# Patient Record
Sex: Female | Born: 1965
Health system: Southern US, Community
[De-identification: ages and names within clinical notes are randomized; demographics above are authoritative.]

## PROBLEM LIST (undated history)

## (undated) DIAGNOSIS — M1990 Unspecified osteoarthritis, unspecified site: Secondary | ICD-10-CM

## (undated) DIAGNOSIS — N926 Irregular menstruation, unspecified: Secondary | ICD-10-CM

## (undated) DIAGNOSIS — D219 Benign neoplasm of connective and other soft tissue, unspecified: Secondary | ICD-10-CM

## (undated) DIAGNOSIS — J302 Other seasonal allergic rhinitis: Secondary | ICD-10-CM

## (undated) DIAGNOSIS — J45909 Unspecified asthma, uncomplicated: Secondary | ICD-10-CM

## (undated) DIAGNOSIS — E119 Type 2 diabetes mellitus without complications: Secondary | ICD-10-CM

## (undated) HISTORY — PX: TONSILLECTOMY AND ADENOIDECTOMY: SHX28

## (undated) HISTORY — DX: Irregular menstruation, unspecified: N92.6

## (undated) HISTORY — DX: Benign neoplasm of connective and other soft tissue, unspecified: D21.9

## (undated) HISTORY — DX: Unspecified osteoarthritis, unspecified site: M19.90

## (undated) HISTORY — PX: ABDOMINAL HYSTERECTOMY: SHX81

## (undated) HISTORY — DX: Other seasonal allergic rhinitis: J30.2

## (undated) HISTORY — DX: Type 2 diabetes mellitus without complications: E11.9

## (undated) HISTORY — DX: Unspecified asthma, uncomplicated: J45.909

## (undated) HISTORY — PX: TONSILLECTOMY: SUR1361

## (undated) HISTORY — PX: TUBAL LIGATION: SHX77

---

## 2000-12-23 ENCOUNTER — Emergency Department (HOSPITAL_COMMUNITY): Admission: EM | Admit: 2000-12-23 | Discharge: 2000-12-24 | Payer: Self-pay | Admitting: *Deleted

## 2001-12-21 ENCOUNTER — Emergency Department (HOSPITAL_COMMUNITY): Admission: EM | Admit: 2001-12-21 | Discharge: 2001-12-21 | Payer: Self-pay

## 2002-08-24 ENCOUNTER — Ambulatory Visit (HOSPITAL_COMMUNITY): Admission: RE | Admit: 2002-08-24 | Discharge: 2002-08-24 | Payer: Self-pay | Admitting: Obstetrics

## 2002-08-24 ENCOUNTER — Encounter: Payer: Self-pay | Admitting: Obstetrics

## 2002-11-06 ENCOUNTER — Encounter: Payer: Self-pay | Admitting: Cardiology

## 2002-11-06 ENCOUNTER — Encounter: Admission: RE | Admit: 2002-11-06 | Discharge: 2002-11-06 | Payer: Self-pay | Admitting: Cardiology

## 2002-11-07 ENCOUNTER — Emergency Department (HOSPITAL_COMMUNITY): Admission: AD | Admit: 2002-11-07 | Discharge: 2002-11-07 | Payer: Self-pay | Admitting: *Deleted

## 2003-09-13 ENCOUNTER — Ambulatory Visit (HOSPITAL_COMMUNITY): Admission: RE | Admit: 2003-09-13 | Discharge: 2003-09-13 | Payer: Self-pay | Admitting: Obstetrics

## 2004-09-05 ENCOUNTER — Ambulatory Visit (HOSPITAL_COMMUNITY): Admission: RE | Admit: 2004-09-05 | Discharge: 2004-09-05 | Payer: Self-pay | Admitting: Neurology

## 2004-10-10 ENCOUNTER — Ambulatory Visit (HOSPITAL_COMMUNITY): Admission: RE | Admit: 2004-10-10 | Discharge: 2004-10-10 | Payer: Self-pay | Admitting: Obstetrics

## 2005-10-10 ENCOUNTER — Ambulatory Visit (HOSPITAL_COMMUNITY): Admission: RE | Admit: 2005-10-10 | Discharge: 2005-10-10 | Payer: Self-pay | Admitting: Cardiology

## 2007-01-06 ENCOUNTER — Emergency Department (HOSPITAL_COMMUNITY): Admission: EM | Admit: 2007-01-06 | Discharge: 2007-01-07 | Payer: Self-pay | Admitting: Emergency Medicine

## 2007-01-09 ENCOUNTER — Emergency Department (HOSPITAL_COMMUNITY): Admission: EM | Admit: 2007-01-09 | Discharge: 2007-01-09 | Payer: Self-pay | Admitting: Emergency Medicine

## 2008-07-14 ENCOUNTER — Emergency Department (HOSPITAL_COMMUNITY): Admission: EM | Admit: 2008-07-14 | Discharge: 2008-07-15 | Payer: Self-pay | Admitting: Emergency Medicine

## 2009-02-22 ENCOUNTER — Ambulatory Visit (HOSPITAL_COMMUNITY): Admission: RE | Admit: 2009-02-22 | Discharge: 2009-02-22 | Payer: Self-pay | Admitting: Obstetrics

## 2009-03-01 ENCOUNTER — Encounter: Admission: RE | Admit: 2009-03-01 | Discharge: 2009-03-01 | Payer: Self-pay | Admitting: Obstetrics

## 2010-03-30 ENCOUNTER — Ambulatory Visit (HOSPITAL_COMMUNITY)
Admission: RE | Admit: 2010-03-30 | Discharge: 2010-03-30 | Payer: No Typology Code available for payment source | Source: Home / Self Care | Attending: Obstetrics | Admitting: Obstetrics

## 2010-04-09 ENCOUNTER — Encounter: Payer: Self-pay | Admitting: Obstetrics
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
## 2010-08-16 ENCOUNTER — Emergency Department (HOSPITAL_COMMUNITY)
Admission: EM | Admit: 2010-08-16 | Discharge: 2010-08-17 | Disposition: A | Discharge status: Home / Self Care | Payer: Self-pay | Attending: Emergency Medicine | Admitting: Emergency Medicine

## 2010-08-16 DIAGNOSIS — N898 Other specified noninflammatory disorders of vagina: Secondary | ICD-10-CM | POA: Insufficient documentation

## 2010-08-16 DIAGNOSIS — R319 Hematuria, unspecified: Secondary | ICD-10-CM | POA: Insufficient documentation

## 2010-08-16 DIAGNOSIS — R109 Unspecified abdominal pain: Secondary | ICD-10-CM | POA: Insufficient documentation

## 2010-08-16 LAB — CBC
Hemoglobin: 13.7 g/dL (ref 12.0–15.0)
MCH: 28.6 pg (ref 26.0–34.0)
MCV: 85.6 fL (ref 78.0–100.0)
RBC: 4.79 MIL/uL (ref 3.87–5.11)
WBC: 16.8 10*3/uL — ABNORMAL HIGH (ref 4.0–10.5)

## 2010-08-16 LAB — DIFFERENTIAL
Eosinophils Absolute: 0.6 10*3/uL (ref 0.0–0.7)
Lymphs Abs: 4.9 10*3/uL — ABNORMAL HIGH (ref 0.7–4.0)
Monocytes Absolute: 0.9 10*3/uL (ref 0.1–1.0)
Neutro Abs: 10.4 10*3/uL — ABNORMAL HIGH (ref 1.7–7.7)
Neutrophils Relative %: 62 % (ref 43–77)

## 2010-08-16 LAB — URINALYSIS, ROUTINE W REFLEX MICROSCOPIC
Glucose, UA: NEGATIVE mg/dL
Leukocytes, UA: NEGATIVE
Nitrite: NEGATIVE
Specific Gravity, Urine: 1.027 (ref 1.005–1.030)
Urobilinogen, UA: 1 mg/dL (ref 0.0–1.0)
pH: 5 (ref 5.0–8.0)

## 2010-08-16 LAB — PREGNANCY, URINE: Preg Test, Ur: NEGATIVE

## 2010-08-16 LAB — BASIC METABOLIC PANEL
Chloride: 102 mEq/L (ref 96–112)
Creatinine, Ser: 0.67 mg/dL (ref 0.4–1.2)
GFR calc non Af Amer: >60 mL/min (ref >60)

## 2010-08-16 LAB — URINE MICROSCOPIC-ADD ON

## 2010-08-17 LAB — WET PREP, GENITAL
Trich, Wet Prep: NONE SEEN
Yeast Wet Prep HPF POC: NONE SEEN

## 2010-08-18 ENCOUNTER — Inpatient Hospital Stay (HOSPITAL_COMMUNITY)
Admission: AD | Admit: 2010-08-18 | Discharge: 2010-08-18 | Disposition: A | Discharge status: Home / Self Care | Payer: Self-pay | Source: Ambulatory Visit | Attending: Obstetrics & Gynecology | Admitting: Obstetrics & Gynecology

## 2010-08-18 ENCOUNTER — Inpatient Hospital Stay (HOSPITAL_COMMUNITY): Payer: Self-pay

## 2010-08-18 DIAGNOSIS — N949 Unspecified condition associated with female genital organs and menstrual cycle: Secondary | ICD-10-CM | POA: Insufficient documentation

## 2010-08-18 DIAGNOSIS — D259 Leiomyoma of uterus, unspecified: Secondary | ICD-10-CM

## 2010-08-18 DIAGNOSIS — N938 Other specified abnormal uterine and vaginal bleeding: Secondary | ICD-10-CM

## 2010-08-18 LAB — CBC
HCT: 37.9 % (ref 36.0–46.0)
Hemoglobin: 12.4 g/dL (ref 12.0–15.0)
MCH: 28.4 pg (ref 26.0–34.0)
RBC: 4.36 MIL/uL (ref 3.87–5.11)
RDW: 13 % (ref 11.5–15.5)
WBC: 12.9 10*3/uL — ABNORMAL HIGH (ref 4.0–10.5)

## 2010-08-18 LAB — WET PREP, GENITAL
Trich, Wet Prep: NONE SEEN
Yeast Wet Prep HPF POC: NONE SEEN

## 2010-08-18 LAB — URINALYSIS, ROUTINE W REFLEX MICROSCOPIC
Glucose, UA: NEGATIVE mg/dL
Nitrite: NEGATIVE
Specific Gravity, Urine: >1.03 — ABNORMAL HIGH (ref 1.005–1.030)

## 2010-10-05 ENCOUNTER — Encounter: Payer: Self-pay | Admitting: Obstetrics & Gynecology

## 2010-10-16 ENCOUNTER — Emergency Department (HOSPITAL_COMMUNITY)
Admission: EM | Admit: 2010-10-16 | Discharge: 2010-10-17 | Disposition: A | Discharge status: Home / Self Care | Payer: No Typology Code available for payment source | Attending: Emergency Medicine | Admitting: Emergency Medicine

## 2010-10-16 DIAGNOSIS — T148XXA Other injury of unspecified body region, initial encounter: Secondary | ICD-10-CM | POA: Insufficient documentation

## 2010-10-16 DIAGNOSIS — M25519 Pain in unspecified shoulder: Secondary | ICD-10-CM | POA: Insufficient documentation

## 2010-10-16 DIAGNOSIS — S139XXA Sprain of joints and ligaments of unspecified parts of neck, initial encounter: Secondary | ICD-10-CM | POA: Insufficient documentation

## 2010-10-16 DIAGNOSIS — M542 Cervicalgia: Secondary | ICD-10-CM | POA: Insufficient documentation

## 2010-10-16 DIAGNOSIS — M549 Dorsalgia, unspecified: Secondary | ICD-10-CM | POA: Insufficient documentation

## 2010-10-17 ENCOUNTER — Emergency Department (HOSPITAL_COMMUNITY): Payer: No Typology Code available for payment source

## 2010-11-09 ENCOUNTER — Encounter: Payer: Self-pay | Admitting: Family Medicine

## 2010-11-09 ENCOUNTER — Other Ambulatory Visit (HOSPITAL_COMMUNITY)
Admission: RE | Admit: 2010-11-09 | Discharge: 2010-11-09 | Disposition: A | Discharge status: Home / Self Care | Payer: Self-pay | Source: Ambulatory Visit | Attending: Family Medicine | Admitting: Family Medicine

## 2010-11-09 ENCOUNTER — Encounter: Payer: No Typology Code available for payment source | Admitting: Family Medicine

## 2010-11-09 ENCOUNTER — Ambulatory Visit (INDEPENDENT_AMBULATORY_CARE_PROVIDER_SITE_OTHER): Payer: No Typology Code available for payment source | Admitting: Family Medicine

## 2010-11-09 VITALS — BP 122/82 | HR 89 | Ht 60.5 in | Wt 158.7 lb

## 2010-11-09 DIAGNOSIS — N920 Excessive and frequent menstruation with regular cycle: Secondary | ICD-10-CM | POA: Insufficient documentation

## 2010-11-09 DIAGNOSIS — F17200 Nicotine dependence, unspecified, uncomplicated: Secondary | ICD-10-CM

## 2010-11-09 DIAGNOSIS — D219 Benign neoplasm of connective and other soft tissue, unspecified: Secondary | ICD-10-CM

## 2010-11-09 DIAGNOSIS — D259 Leiomyoma of uterus, unspecified: Secondary | ICD-10-CM

## 2010-11-09 DIAGNOSIS — Z720 Tobacco use: Secondary | ICD-10-CM

## 2010-11-09 LAB — TSH: TSH: 0.961 u[IU]/mL (ref 0.350–4.500)

## 2010-11-09 NOTE — Patient Instructions (Addendum)
You had an endometrial biopsy today to rule out pre-cancerous or cancerous changes.  Cramping is normal.  Ibuprofen should help.  Results will be available in 2 weeks and we will review these with you at your next appointment.  Fibroids (Leiomyoma's) You have been diagnosed as having a fibroid. Fibroids are smooth muscle lumps (tumors) which can occur any place in a woman's body. They are usually in the womb (uterus). The most common problem (symptom) of fibroids is bleeding. Over time this may cause low red blood cells (anemia). Other symptoms include feelings of pressure and pain in the pelvis. The diagnosis (learning what is wrong) of fibroids is made by physical exam. Sometimes tests such as an ultrasound are used. This is helpful when fibroids are felt around the ovaries and to look for tumors. TREATMENT  Most fibroids do not need surgical or medical treatment. Sometimes a tissue sample (biopsy) of the lining of the uterus is done to rule out cancer. If there is no cancer and only a small amount of bleeding, the problem can be watched.   Hormonal treatment can improve the problem.   When surgery is needed, it can consist of removing the fibroid. Vaginal birth may not be possible after the removal of fibroids. This depends on where they are and the extent of surgery. When pregnancy occurs with fibroids it is usually normal.   Your caregiver can help decide which treatments are best for you.  HOME CARE INSTRUCTIONS  Do not use aspirin as this may increase bleeding problems.   If your periods (menses) are heavy, record the number of pads or tampons used per month. Bring this information to your caregiver. This can help them determine the best treatment for you.  SEEK IMMEDIATE MEDICAL ATTENTION IF:  You have pelvic pain or cramps not controlled with medications, or experience a sudden increase in pain.   You have an increase of pelvic bleeding between and during menses.   You feel  lightheaded or have fainting spells.   You develop worsening belly (abdominal) pain.  Document Released: 03/02/2000 Document Re-Released: 06/12/2007 [facility] Patient Information 2011 [place], [place].Menorrhagia, Heavy Periods Dysfunctional uterine bleeding is different from a normal menstrual period. When periods are heavy or there is more bleeding than is usual for you, it is called menorrhagia. It may be caused by hormonal imbalance, or physical, metabolic, or other problems. Examination is necessary in order that your caregiver may treat treatable causes. If this is a continuing problem, a D&C may be needed. That means that the cervix (the opening of the uterus or womb) is dilated (stretched larger) and the lining of the uterus is scraped out. The tissue scraped out is then examined under a microscope by a specialist (pathologist) to make sure there is nothing of concern that needs further or more extensive treatment. HOME CARE INSTRUCTIONS  If medications were prescribed, take exactly as directed. Do not change or switch medications without consulting your caregiver.   Long term heavy bleeding may result in iron deficiency. Your caregiver may have prescribed iron pills. They help replace the iron your body lost from heavy bleeding. Take exactly as directed. Iron may cause constipation. If this becomes a problem, increase the bran, fruits, and roughage in your diet.   Do not take aspirin or medicines that contain aspirin one week before or during your menstrual period. Aspirin may make the bleeding worse.   If you need to change your sanitary pad or tampon more than once every  2 hours, stay in bed and rest as much as possible until the bleeding stops.   Eat well-balanced meals. Eat foods high in iron. Examples are leafy green vegetables, meat, liver, eggs, and whole grain breads and cereals. Do not try to lose weight until the abnormal bleeding has stopped and your blood iron level is back to  normal.  SEEK MEDICAL CARE IF:  You need to change your sanitary pad or tampon more than once an hour.   You develop nausea (feeling sick to your stomach) and vomiting, dizziness, or diarrhea while you are taking your medicine.   You have any problems that may be related to the medicine you are taking.  SEEK IMMEDIATE MEDICAL CARE IF:  An oral temperature above 101 develops.   You develop chills.   You develop severe bleeding or start to pass blood clots.   You feel dizzy or faint.  MAKE SURE YOU:   Understand these instructions.   Will watch your condition.   Will get help right away if you are not doing well or get worse.  Document Released: 03/05/2005 Document Re-Released: 02/16/2008 [facility] Patient Information 2011 [place], [place].Smoking Cessation This document explains the best ways for you to quit smoking and new treatments to help. It lists new medicines that can double or triple your chances of quitting and quitting for good. It also considers ways to avoid relapses and concerns you may have about quitting, including weight gain. NICOTINE: A POWERFUL ADDICTION If you have tried to quit smoking, you know how hard it can be. It is hard because nicotine is a very addictive drug. For some people, it can be as addictive as heroin or cocaine. Usually, people make 2 or 3 tries, or more, before finally being able to quit. Each time you try to quit, you can learn about what helps and what hurts. Quitting takes hard work and a lot of effort, but you can quit smoking. QUITTING SMOKING IS ONE OF THE MOST IMPORTANT THINGS YOU WILL EVER DO:  You will live longer, feel better, and live better.   The impact on your body of quitting smoking is felt almost immediately:   Within 20 minutes, blood pressure decreases. Pulse returns to its normal level.   After 8 hours, carbon monoxide levels in the blood return to normal. Oxygen level increases.   After 24 hours, chance of heart attack  starts to decrease. Breath, hair, and body stop smelling like smoke.   After 48 hours, damaged nerve endings begin to recover. Sense of taste and smell improve.   After 72 hours, the body is virtually free of nicotine. Bronchial tubes relax and breathing becomes easier.   After 2 to 12 weeks, lungs can hold more air. Exercise becomes easier and circulation improves.   Quitting will lower your chance of having a heart attack, stroke, cancer, or lung disease:   After 1 year, the risk of coronary heart disease is cut in half.   After 5 years, the risk of stroke falls to the same as a nonsmoker.   After 10 years, the risk of lung cancer is cut in half and the risk of other cancers decreases significantly.   After 15 years, the risk of coronary heart disease drops, usually to the level of a nonsmoker.   If you are pregnant, quitting smoking will improve your chances of having a healthy baby.   The people you live with, especially your children, will be healthier.   You will  have extra money to spend on things other than cigarettes.  FIVE KEYS TO QUITTING Studies have shown that these 5 steps will help you quit smoking and quit for good. You have the best chances of quitting if you use them together: 1. Get ready.  2. Get support and encouragement.  3. Learn new skills and behaviors.  4. Get medicine to reduce your nicotine addiction and use it correctly.  5. Be prepared for relapse or difficult situations. Be determined to continue trying to quit, even if you do not succeed at first.  1. GET READY  Set a quit date.   Change your environment.   Get rid of ALL cigarettes, ashtrays, matches, and lighters in your home, car, and place of work.   Do not let people smoke in your home.   Review your past attempts to quit. Think about what worked and what did not.   Once you quit, do not smoke. NOT EVEN A PUFF!  2. GET SUPPORT AND ENCOURAGEMENT Studies have shown that you have a better  chance of being successful if you have help. You can get support in many ways.  Tell your family, friends, and coworkers that you are going to quit and need their support. Ask them not to smoke around you.   Talk to your caregivers (doctor, dentist, nurse, pharmacist, psychologist, and/or smoking counselor).   Get individual, group, or telephone counseling and support. The more counseling you have, the better your chances are of quitting. Programs are available at [employer] and health centers. Call your local health department for information about programs in your area.   Spiritual beliefs and practices may help some smokers quit.   Quit meters are Photographer that keep track of quit statistics, such as amount of "quit-time," cigarettes not smoked, and money saved.   Many smokers find one or more of the many self-help books available useful in helping them quit and stay off tobacco.  3. LEARN NEW SKILLS AND BEHAVIORS  Try to distract yourself from urges to smoke. Talk to someone, go for a walk, or occupy your time with a task.   When you first try to quit, change your routine. Take a different route to work. Drink tea instead of coffee. Eat breakfast in a different place.   Do something to reduce your stress. Take a hot bath, exercise, or read a book.   Plan something enjoyable to do every day. Reward yourself for not smoking.   Explore interactive web-based programs that specialize in helping you quit.  4. GET MEDICINE AND USE IT CORRECTLY Medicines can help you stop smoking and decrease the urge to smoke. Combining medicine with the above behavioral methods and support can quadruple your chances of successfully quitting smoking. The [place] Food and Drug Administration (FDA) has approved 7 medicines to help you quit smoking. These medicines fall into 3 categories.  Nicotine replacement therapy (delivers nicotine to your body without the negative  effects and risks of smoking):   Nicotine gum: Available over-the-counter.   Nicotine lozenges: Available over-the-counter.   Nicotine inhaler: Available by prescription.   Nicotine nasal spray: Available by prescription.   Nicotine skin patches (transdermal): Available by prescription and over-the-counter.   Antidepressant medicine (helps people abstain from smoking, but how this works is unknown):   Bupropion sustained-release (SR) tablets: Available by prescription.   Nicotinic receptor partial agonist (simulates the effect of nicotine in your brain):   Varenicline tartrate tablets: Available  by prescription.   Ask your caregiver for advice about which medicines to use and how to use them. Carefully read the information on the package.   Everyone who is trying to quit may benefit from using a medicine. If you are pregnant or trying to become pregnant, nursing an infant, you are under age 26, or you smoke fewer than 10 cigarettes per day, talk to your caregiver before taking any nicotine replacement medicines.   You should stop using a nicotine replacement product and call your caregiver if you experience nausea, dizziness, weakness, vomiting, fast or irregular heartbeat, mouth problems with the lozenge or gum, or redness or swelling of the skin around the patch that does not go away.   Do not use any other product containing nicotine while using a nicotine replacement product.   Talk to your caregiver before using these products if you have diabetes, heart disease, asthma, stomach ulcers, you had a recent heart attack, you have high blood pressure that is not controlled with medicine, a history of irregular heartbeat, or you have been prescribed medicine to help you quit smoking.  5. BE PREPARED FOR RELAPSE OR DIFFICULT SITUATIONS  Most relapses occur within the first 3 months after quitting. Do not be discouraged if you start smoking again. Remember, most people try several times  before they finally quit.   You may have symptoms of withdrawal because your body is used to nicotine. You may crave cigarettes, be irritable, feel very hungry, cough often, get headaches, or have difficulty concentrating.   The withdrawal symptoms are only temporary. They are strongest when you first quit, but they will go away within 10 to 14 days.  Here are some difficult situations to watch for:  Alcohol. Avoid drinking alcohol. Drinking lowers your chances of successfully quitting.   Caffeine. Try to reduce the amount of caffeine you consume. It also lowers your chances of successfully quitting.   Other smokers. Being around smoking can make you want to smoke. Avoid smokers.   Weight gain. Many smokers will gain weight when they quit, usually less than 10 pounds. Eat a healthy diet and stay active. Do not let weight gain distract you from your main goal, quitting smoking. Some medicines that help you quit smoking may also help delay weight gain. You can always lose the weight gained after you quit.   Bad mood or depression. There are a lot of ways to improve your mood other than smoking.  If you are having problems with any of these situations, talk to your caregiver. SPECIAL SITUATIONS OR CONDITIONS Studies suggest that everyone can quit smoking. Your situation or condition can give you a special reason to quit.  Pregnant women/New mothers: By quitting, you protect your baby's health and your own.   Hospitalized patients: By quitting, you reduce health problems and help healing.   Heart attack patients: By quitting, you reduce your risk of a second heart attack.   Lung, head, and neck cancer patients: By quitting, you reduce your chance of a second cancer.   Parents of children and adolescents: By quitting, you protect your children from illnesses caused by secondhand smoke.  QUESTIONS TO THINK ABOUT Think about the following questions before you try to stop smoking. You may want  to talk about your answers with your caregiver.  Why do you want to quit?   If you tried to quit in the past, what helped and what did not?   What will be the most difficult  situations for you after you quit? How will you plan to handle them?   Who can help you through the tough times? Your family? Friends? Caregiver?   What pleasures do you get from smoking? What ways can you still get pleasure if you quit?  Here are some questions to ask your caregiver:  How can you help me to be successful at quitting?   What medicine do you think would be best for me and how should I take it?   What should I do if I need more help?   What is smoking withdrawal like? How can I get information on withdrawal?  Quitting takes hard work and a lot of effort, but you can quit smoking. FOR MORE INFORMATION Smokefree.gov (http://www.davis-sullivan.com/) provides free, accurate, evidence-based information and professional assistance to help support the immediate and long-term needs of people trying to quit smoking. Document Released: 02/27/2001 Document Re-Released: 08/23/2009 [employer] Patient Information 2011 [place], [place].

## 2010-11-09 NOTE — Progress Notes (Signed)
  Subjective:    Patient ID: Sarah Tyler, female    DOB: May 13, 1965, 45 y.o.   MRN: [id number]  HPI Comments: Seen in [facility] in May with abnormal bleeding.  Placed on OCP's which she has used for several months with excellent cycle control.  Found in [facility] to have submucosal fibroid with > 50% submucosal in location.  Has had hot flashes as well.     Review of Systems  Constitutional: Negative for fever, chills, activity change and appetite change.  Respiratory: Negative for chest tightness and stridor.   Cardiovascular: Negative for chest pain.  Gastrointestinal: Negative for nausea, vomiting, abdominal pain, diarrhea, constipation and abdominal distention.  Genitourinary: Positive for vaginal bleeding and menstrual problem. Negative for dysuria, vaginal discharge, vaginal pain and dyspareunia.  Musculoskeletal: Negative for back pain.       Objective:   Physical Exam  Constitutional: She appears well-developed and well-nourished.  HENT:  Head: Normocephalic and atraumatic.  Cardiovascular: Normal rate.   Pulmonary/Chest: Effort normal.  Abdominal: Soft. There is no tenderness.  Genitourinary: Vagina normal.       Cervix with old scar, no lesion Patient given informed consent, signed copy in the chart, time out was performed. Appropriate time out taken. . The patient was placed in the lithotomy position and the cervix brought into view with sterile speculum.  Portio of cervix cleansed x 2 with betadine swabs.  A tenaculum was placed in the anterior lip of the cervix.  The uterus was sounded for depth of 8cm. A pipelle was introduced to into the uterus, suction created,  and an endometrial sample was obtained. All equipment was removed and accounted for.  The patient tolerated the procedure well.              Assessment & Plan:  Menorrhagia-check TSH, FSH Fibroid uterus S/p EMB-await results, f/u 2 wks. Abnormal pap in 05/2010-->for f/u in 11/2010.

## 2010-12-07 ENCOUNTER — Inpatient Hospital Stay (INDEPENDENT_AMBULATORY_CARE_PROVIDER_SITE_OTHER)
Admission: RE | Admit: 2010-12-07 | Discharge: 2010-12-07 | Disposition: A | Discharge status: Home / Self Care | Payer: Self-pay | Source: Ambulatory Visit | Attending: Emergency Medicine | Admitting: Emergency Medicine

## 2010-12-07 DIAGNOSIS — H6500 Acute serous otitis media, unspecified ear: Secondary | ICD-10-CM

## 2010-12-07 DIAGNOSIS — J45909 Unspecified asthma, uncomplicated: Secondary | ICD-10-CM

## 2010-12-07 DIAGNOSIS — J309 Allergic rhinitis, unspecified: Secondary | ICD-10-CM

## 2010-12-13 ENCOUNTER — Ambulatory Visit (INDEPENDENT_AMBULATORY_CARE_PROVIDER_SITE_OTHER): Payer: Self-pay | Admitting: Obstetrics & Gynecology

## 2010-12-13 ENCOUNTER — Encounter: Payer: Self-pay | Admitting: Obstetrics & Gynecology

## 2010-12-13 VITALS — BP 115/86 | HR 103 | Temp 97.8°F | Ht 60.5 in | Wt 159.4 lb

## 2010-12-13 DIAGNOSIS — N951 Menopausal and female climacteric states: Secondary | ICD-10-CM

## 2010-12-13 NOTE — Progress Notes (Signed)
  Subjective:    Patient ID: Sarah Tyler, female    DOB: Jan 29, 1966, 45 y.o.   MRN: [id number]  HPI  She is here to discuss her embx and lab results.  She has had no vaginal bleeding since she was seen here (abstinent and h/o BTL).  She has occasional hot flashes.  Review of Systems    all normal lab/biopsy results Objective:   Physical Exam        Assessment & Plan:  Perimenopause-reassurance given.

## 2011-04-20 ENCOUNTER — Other Ambulatory Visit (HOSPITAL_COMMUNITY): Payer: Self-pay | Admitting: Obstetrics

## 2011-04-20 DIAGNOSIS — Z1231 Encounter for screening mammogram for malignant neoplasm of breast: Secondary | ICD-10-CM

## 2011-05-18 ENCOUNTER — Ambulatory Visit (HOSPITAL_COMMUNITY)
Admission: RE | Admit: 2011-05-18 | Discharge: 2011-05-18 | Disposition: A | Discharge status: Home / Self Care | Payer: Self-pay | Source: Ambulatory Visit | Attending: Obstetrics | Admitting: Obstetrics

## 2011-05-18 DIAGNOSIS — Z1231 Encounter for screening mammogram for malignant neoplasm of breast: Secondary | ICD-10-CM

## 2011-07-20 ENCOUNTER — Emergency Department (INDEPENDENT_AMBULATORY_CARE_PROVIDER_SITE_OTHER): Payer: Self-pay

## 2011-07-20 ENCOUNTER — Encounter (HOSPITAL_COMMUNITY): Payer: Self-pay

## 2011-07-20 ENCOUNTER — Emergency Department (INDEPENDENT_AMBULATORY_CARE_PROVIDER_SITE_OTHER)
Admission: EM | Admit: 2011-07-20 | Discharge: 2011-07-20 | Disposition: A | Discharge status: Home / Self Care | Payer: Self-pay | Source: Home / Self Care | Attending: Emergency Medicine | Admitting: Emergency Medicine

## 2011-07-20 DIAGNOSIS — IMO0002 Reserved for concepts with insufficient information to code with codable children: Secondary | ICD-10-CM

## 2011-07-20 DIAGNOSIS — S62637B Displaced fracture of distal phalanx of left little finger, initial encounter for open fracture: Secondary | ICD-10-CM

## 2011-07-20 DIAGNOSIS — S61209A Unspecified open wound of unspecified finger without damage to nail, initial encounter: Secondary | ICD-10-CM

## 2011-07-20 DIAGNOSIS — T148XXA Other injury of unspecified body region, initial encounter: Secondary | ICD-10-CM

## 2011-07-20 DIAGNOSIS — S62639B Displaced fracture of distal phalanx of unspecified finger, initial encounter for open fracture: Secondary | ICD-10-CM

## 2011-07-20 DIAGNOSIS — S61319A Laceration without foreign body of unspecified finger with damage to nail, initial encounter: Secondary | ICD-10-CM

## 2011-07-20 MED ORDER — IBUPROFEN 800 MG PO TABS
800.0000 mg | ORAL_TABLET | Freq: Once | ORAL | Status: AC
  Administered 2011-07-20: 800 mg via ORAL

## 2011-07-20 MED ORDER — IBUPROFEN 800 MG PO TABS
ORAL_TABLET | ORAL | Status: AC
  Filled 2011-07-20: qty 1

## 2011-07-20 MED ORDER — OXYCODONE-ACETAMINOPHEN 5-325 MG PO TABS: ORAL_TABLET | ORAL | Status: AC

## 2011-07-20 MED ORDER — CEPHALEXIN 500 MG PO CAPS: 500.0000 mg | ORAL_CAPSULE | Freq: Three times a day (TID) | ORAL | Status: AC

## 2011-07-20 NOTE — ED Provider Notes (Signed)
Chief Complaint  Patient presents with  . Finger Injury    History of Present Illness:  Sarah Tyler is a 46 year old female who smashed the tip of right index finger in a car door about an hour before coming here. Her nail is broken and avulsed. The tip of the finger is painful. Her last tetanus vaccine was 2 years ago.  Review of Systems:  Other than noted above, the patient denies any of the following symptoms: Systemic:  No fever or chills. Musculoskeletal:  No joint pain or decreased range of motion. Neuro:  No numbness, tingling, or weakness.  PMFSH:  Past medical history, family history, social history, meds, and allergies were reviewed.  Physical Exam:   Vital signs:  BP 115/78  Pulse 87  Temp(Src) 97.9 F (36.6 C) (Oral)  Resp 16  SpO2 100%  LMP 06/29/2011 Ext:  She has acrylic nail on this is broken as well as her real nail and almost completely avulsed. There is pain to palpation over the distal phalanx.  All joints had a full ROM without pain.  Pulses were full.  Good capillary refill in all digits.  No edema. Neurological:  Alert and oriented.  No muscle weakness.  Sensation was intact to light touch.   Dg Finger Index Right  07/20/2011  *RADIOLOGY REPORT*  Clinical Data: Smashed right index finger.  RIGHT INDEX FINGER 2+V  Comparison: None  Findings: There is a soft tissue defect involving the distal tip of the finger.  Probable distal tuft fracture.  No radiopaque foreign body.  IMPRESSION: Distal tuft fracture but no radiopaque foreign body.  Original Report Authenticated By: P. Loralie Champagne, M.D.   Procedure: Verbal informed consent was obtained.  The patient was informed of the risks and benefits of the procedure and understands and accepts.  Identity of the patient was verified verbally and by wristband.   The laceration area described above was prepped with Betadine and anesthetized with a digital block with 5 mL of 2% Xylocaine without epinephrine.  The wound was  then closed as follows:  The nail was avulsed completely revealing a small, U-shaped laceration of the nailbed. This was sutured with 4 5-0 nylon sutures.  There were no immediate complications, and the patient tolerated the procedure well. The laceration was then cleansed, Bacitracin ointment was applied and a clean, dry pressure dressing was put on. The patient was put in a finger splint.  Assessment:  The primary encounter diagnosis was Open fracture of distal phalanx of fifth finger of left hand. Diagnoses of Nail avulsion and Laceration of finger nail bed were also pertinent to this visit.  Plan:   1.  The following meds were prescribed:   New Prescriptions   CEPHALEXIN (KEFLEX) 500 MG CAPSULE    Take 1 capsule (500 mg total) by mouth 3 (three) times daily.   OXYCODONE-ACETAMINOPHEN (PERCOCET) 5-325 MG PER TABLET    1 to 2 tablets every 6 hours as needed for pain.   2.  The patient was instructed in wound care and pain control, and handouts were given. 3.  The patient was told to followup with Dr. Magnus Ivan early next week, leave the dressing in place until then.   Reuben Likes, MD 07/20/11 2211

## 2011-07-20 NOTE — ED Notes (Signed)
Pt closed car door on her rt index finger today.  Has acrylic nails over natural nail. Oozing noted from break in nail.

## 2011-07-20 NOTE — Discharge Instructions (Signed)
Fingernail Removal Fingernails may need to be removed because of injury, infections, or correction of abnormal growth. A special non-stick bandage has been put on your finger tightly to prevent bleeding. Fingernails will usually grow back if the finger has not been badly injured and you carefully follow instructions. HOME CARE INSTRUCTIONS   Keep your hand elevated above your heart to relieve pain and swelling.   Keep your dressing dry and clean.   Change your bandage in 24 hours.   After your bandage is changed, soak your hand in warm soapy water for 10 to 20 minutes. Do this 3 times per day. This helps reduce pain and swelling. After soaking your hand, apply a clean, dry bandage. Change your bandage if it is wet or dirty.   Only take over-the-counter or prescription medicines for pain, discomfort, or fever as directed by your caregiver.   See your caregiver as needed for problems.   You may have received an instruction to follow up with your caregiver or a specialist. The failure to follow up as instructed could result in the permanent loss of a fingernail.  SEEK IMMEDIATE MEDICAL CARE IF:   You have increased pain, swelling, drainage, or bleeding.   You have a fever.  MAKE SURE YOU:   Understand these instructions.   Will watch your condition.   Will get help right away if you are not doing well or get worse.  Document Released: 03/02/2000 Document Revised: 02/22/2011 Document Reviewed: 07/08/2007 [employer] Patient Information 2012 [employer]. 

## 2012-04-23 ENCOUNTER — Other Ambulatory Visit: Payer: Self-pay | Admitting: Obstetrics & Gynecology

## 2012-04-23 DIAGNOSIS — Z1231 Encounter for screening mammogram for malignant neoplasm of breast: Secondary | ICD-10-CM

## 2012-05-20 ENCOUNTER — Ambulatory Visit (HOSPITAL_COMMUNITY)
Admission: RE | Admit: 2012-05-20 | Discharge: 2012-05-20 | Disposition: A | Discharge status: Home / Self Care | Payer: Self-pay | Source: Ambulatory Visit | Attending: Obstetrics & Gynecology | Admitting: Obstetrics & Gynecology

## 2012-05-20 DIAGNOSIS — Z1231 Encounter for screening mammogram for malignant neoplasm of breast: Secondary | ICD-10-CM

## 2012-08-15 ENCOUNTER — Ambulatory Visit: Payer: Self-pay | Admitting: Obstetrics & Gynecology

## 2012-08-15 ENCOUNTER — Ambulatory Visit (INDEPENDENT_AMBULATORY_CARE_PROVIDER_SITE_OTHER): Payer: Self-pay | Admitting: Obstetrics & Gynecology

## 2012-08-15 ENCOUNTER — Encounter: Payer: Self-pay | Admitting: Obstetrics & Gynecology

## 2012-08-15 VITALS — BP 134/87 | HR 81 | Temp 97.2°F | Ht 60.5 in | Wt 162.0 lb

## 2012-08-15 DIAGNOSIS — Z124 Encounter for screening for malignant neoplasm of cervix: Secondary | ICD-10-CM

## 2012-08-15 DIAGNOSIS — D219 Benign neoplasm of connective and other soft tissue, unspecified: Secondary | ICD-10-CM

## 2012-08-15 DIAGNOSIS — D259 Leiomyoma of uterus, unspecified: Secondary | ICD-10-CM

## 2012-08-15 DIAGNOSIS — N951 Menopausal and female climacteric states: Secondary | ICD-10-CM

## 2012-08-15 LAB — POCT PREGNANCY, URINE: Preg Test, Ur: NEGATIVE

## 2012-08-15 NOTE — Patient Instructions (Signed)
Return to clinic for any scheduled appointments or for any gynecologic concerns as needed.   

## 2012-08-15 NOTE — Progress Notes (Signed)
GYNECOLOGY CLINIC PROGRESS NOTE  History:  47 y.o. [id number] here today for to discuss irregular bleeding and obtain pap smear. Reports having abnormal pap in 2011, normal colposcopy, no paps since then.  Mother recently died from cervical cancer, she now wants this done.    Also reports that she had amenorrhea x 6 months, then in the last two months she had one day of bleeding last month and five days/normal menstrual period this month. She had a small 1.1 cm submucosal fibroid last year and wants another ultrasound to ensure it is not "getting too big".  Also reports occasional hot flashes, vaginal dryness and night sweats that are not debilitating.  The following portions of the patient's history were reviewed and updated as appropriate: allergies, current medications, past family history, past medical history, past social history, past surgical history and problem list. Normal mammogram in 05/21/12.  Review of Systems:  Pertinent items are noted in HPI.  Objective:  Physical Exam BP 134/87  Pulse 81  Temp(Src) 97.2 F (36.2 C) (Oral)  Ht 5' 0.5" (1.537 m)  Wt 162 lb (73.483 kg)  BMI 31.11 kg/m2  LMP 08/01/2012 Gen: NAD Breasts: Deferred as per patient request Abd: Soft, nontender and nondistended Pelvic: Normal appearing external genitalia; normal appearing vaginal mucosa and cervix.  Normal discharge.  Pap smear sample obtained; external cervical os noted to have mild stenosis that was breached using the endocervical brush resulting in a small amount of bleeding.  Small uterus, no other palpable masses, no uterine or adnexal tenderness.  Assessment & Plan:  Pap done, will follow up results and manage accordingly. Bleeding pattern consistent with perimenopause. No intervention needed for now.  Bleeding precautions reviewed. Pelvic ultrasound ordered to evaluated submucosal fibroid; will follow up results and manage accordingly. Routine preventative health maintenance measures  emphasized

## 2012-08-22 ENCOUNTER — Ambulatory Visit (HOSPITAL_COMMUNITY)
Admission: RE | Admit: 2012-08-22 | Discharge: 2012-08-22 | Disposition: A | Discharge status: Home / Self Care | Payer: Self-pay | Source: Ambulatory Visit | Attending: Obstetrics & Gynecology | Admitting: Obstetrics & Gynecology

## 2012-08-22 DIAGNOSIS — D250 Submucous leiomyoma of uterus: Secondary | ICD-10-CM | POA: Insufficient documentation

## 2012-08-22 DIAGNOSIS — D219 Benign neoplasm of connective and other soft tissue, unspecified: Secondary | ICD-10-CM

## 2012-08-22 DIAGNOSIS — D251 Intramural leiomyoma of uterus: Secondary | ICD-10-CM | POA: Insufficient documentation

## 2012-08-22 DIAGNOSIS — D252 Subserosal leiomyoma of uterus: Secondary | ICD-10-CM | POA: Insufficient documentation

## 2012-08-27 ENCOUNTER — Telehealth: Payer: Self-pay | Admitting: General Practice

## 2012-08-27 NOTE — Telephone Encounter (Signed)
Called patient, no answer- unable to leave a message due to voicemail box has not been set up yet

## 2012-08-27 NOTE — Telephone Encounter (Signed)
Message copied by Kathee Delton on Wed Aug 27, 2012  4:17 PM ------      Message from: Jaynie Collins A      Created: Tue Aug 26, 2012  9:06 AM       Fibroid in the uterine lining still small at 1 cm, has other small fibroids.  Will consider removal of fibroids if bleeding worsens.  Please call to inform patient of results and recommendations.       ------

## 2012-08-28 ENCOUNTER — Ambulatory Visit: Payer: Self-pay | Admitting: Obstetrics & Gynecology

## 2012-08-28 NOTE — Telephone Encounter (Signed)
Pt informed. She said she will see how this period goes and call for an appointment if she feels it is necessary.

## 2012-10-14 ENCOUNTER — Encounter: Payer: Self-pay | Admitting: Internal Medicine

## 2012-10-14 ENCOUNTER — Other Ambulatory Visit (INDEPENDENT_AMBULATORY_CARE_PROVIDER_SITE_OTHER): Payer: Self-pay

## 2012-10-14 ENCOUNTER — Ambulatory Visit (INDEPENDENT_AMBULATORY_CARE_PROVIDER_SITE_OTHER): Payer: Self-pay | Admitting: Internal Medicine

## 2012-10-14 VITALS — BP 122/84 | HR 92 | Temp 98.2°F | Wt 163.0 lb

## 2012-10-14 DIAGNOSIS — M76899 Other specified enthesopathies of unspecified lower limb, excluding foot: Secondary | ICD-10-CM

## 2012-10-14 DIAGNOSIS — Z1329 Encounter for screening for other suspected endocrine disorder: Secondary | ICD-10-CM

## 2012-10-14 DIAGNOSIS — Z0000 Encounter for general adult medical examination without abnormal findings: Secondary | ICD-10-CM

## 2012-10-14 DIAGNOSIS — Z720 Tobacco use: Secondary | ICD-10-CM

## 2012-10-14 DIAGNOSIS — Z131 Encounter for screening for diabetes mellitus: Secondary | ICD-10-CM

## 2012-10-14 DIAGNOSIS — F17200 Nicotine dependence, unspecified, uncomplicated: Secondary | ICD-10-CM

## 2012-10-14 DIAGNOSIS — E669 Obesity, unspecified: Secondary | ICD-10-CM | POA: Insufficient documentation

## 2012-10-14 DIAGNOSIS — Z13220 Encounter for screening for lipoid disorders: Secondary | ICD-10-CM

## 2012-10-14 DIAGNOSIS — Z130 Encounter for screening for diseases of the blood and blood-forming organs and certain disorders involving the immune mechanism: Secondary | ICD-10-CM

## 2012-10-14 DIAGNOSIS — M7061 Trochanteric bursitis, right hip: Secondary | ICD-10-CM

## 2012-10-14 DIAGNOSIS — J309 Allergic rhinitis, unspecified: Secondary | ICD-10-CM

## 2012-10-14 LAB — LIPID PANEL
HDL: 30.6 mg/dL — ABNORMAL LOW (ref >39.00)
Total CHOL/HDL Ratio: 4

## 2012-10-14 LAB — COMPREHENSIVE METABOLIC PANEL
ALT: 16 U/L (ref 0–35)
AST: 18 U/L (ref 0–37)
Albumin: 3.5 g/dL (ref 3.5–5.2)
Calcium: 9 mg/dL (ref 8.4–10.5)
Chloride: 106 mEq/L (ref 96–112)
Creatinine, Ser: 0.7 mg/dL (ref 0.4–1.2)
Potassium: 3.7 mEq/L (ref 3.5–5.1)
Sodium: 138 mEq/L (ref 135–145)
Total Protein: 7.1 g/dL (ref 6.0–8.3)

## 2012-10-14 LAB — CBC
MCHC: 33.2 g/dL (ref 30.0–36.0)
RDW: 13.1 % (ref 11.5–14.6)

## 2012-10-14 LAB — TSH: TSH: 1.33 u[IU]/mL (ref 0.35–5.50)

## 2012-10-14 MED ORDER — ALBUTEROL SULFATE HFA 108 (90 BASE) MCG/ACT IN AERS: 2.0000 | INHALATION_SPRAY | Freq: Four times a day (QID) | RESPIRATORY_TRACT | Status: DC | PRN

## 2012-10-14 MED ORDER — CETIRIZINE HCL 10 MG PO TABS: 10.0000 mg | ORAL_TABLET | Freq: Every day | ORAL | Status: DC

## 2012-10-14 NOTE — Assessment & Plan Note (Signed)
Will start zyrtec Sample of nasonex given

## 2012-10-14 NOTE — Addendum Note (Signed)
Addended by: Edwena Felty T on: 10/14/2012 11:48 AM   Modules accepted: Orders

## 2012-10-14 NOTE — Progress Notes (Signed)
HPI  Pt presents to the clinic today to establish care. She does not have a PCP. She does have a few concerns. 1: Hip pain: This has been ongoing for about 2 months. She noticed after she starting working out more. She does do squats and thigh exercises. She has taken tylenol which has helps some. The pain is worse when she lays on that hip. She denies specific injury to the area.  2: Difficulty breathin: this started about 1 month ago. She was a former smoker, quit 1.5 moths ago. The breathing issue has not gotten better since she quit smoking. She says it feels like mucous has clogged her throat up. She has no history of allergies that she is aware of. She has not been sick. She denies chest pain, chest tightness or shortness of breath.  Flu: yearly Tetanus: unsure LMP: 10/09/12 Pap smear: 08/2012 Mammogram: 04/2012 Eye doctor: as needed Dentist: as needed  History reviewed. No pertinent past medical history.  Current Outpatient Prescriptions  Medication Sig Dispense Refill  . aspirin 81 MG tablet Take 81 mg by mouth daily.        . fish oil-omega-3 fatty acids 1000 MG capsule Take 2 g by mouth daily.        . Multiple Vitamins-Minerals (MULTIVITAMIN WITH MINERALS) tablet Take 1 tablet by mouth daily.      . Norgestimate-Ethinyl Estradiol Triphasic (TRI-SPRINTEC) 0.18/0.215/0.25 MG-35 MCG tablet Take 1 tablet by mouth daily.         No current facility-administered medications for this visit.    No Known Allergies  Family History  Problem Relation Age of Onset  . Cancer Mother     cervical  . Hypertension Mother     History   Social History  . Marital Status: Single    Spouse Name: N/A    Number of Children: N/A  . Years of Education: N/A   Occupational History  . Not on file.   Social History Main Topics  . Smoking status: Current Every Day Smoker -- 0.25 packs/day    Types: Cigarettes  . Smokeless tobacco: Never Used  . Alcohol Use: No  . Drug Use: No  . Sexually  Active: Not Currently    Birth Control/ Protection: None   Other Topics Concern  . Not on file   Social History Narrative  . No narrative on file    ROS:  Constitutional: Denies fever, malaise, fatigue, headache or abrupt weight changes.  HEENT: Pt reports feeling fluid in her ears and congestion in her throat. Denies eye pain, eye redness, ear pain, ringing in the ears, wax buildup, runny nose, nasal congestion, bloody nose, or sore throat. Respiratory: Denies shortness of breath, cough or sputum production.   Cardiovascular: Denies chest pain, chest tightness, palpitations or swelling in the hands or feet.  Gastrointestinal: Denies abdominal pain, bloating, constipation, diarrhea or blood in the stool.  GU: Denies frequency, urgency, pain with urination, blood in urine, odor or discharge. Musculoskeletal: Pt reports right hip pain. Denies decrease in range of motion, difficulty with gait, muscle pain or joint swelling.  Skin: Denies redness, rashes, lesions or ulcercations.  Neurological: Denies dizziness, difficulty with memory, difficulty with speech or problems with balance and coordination.   No other specific complaints in a complete review of systems (except as listed in HPI above).  PE:  BP 122/84  Pulse 92  Temp(Src) 98.2 F (36.8 C) (Oral)  Wt 163 lb (73.936 kg)  BMI 31.3 kg/m2  SpO2  98% Wt Readings from Last 3 Encounters:  10/14/12 163 lb (73.936 kg)  08/15/12 162 lb (73.483 kg)  12/13/10 159 lb 6.4 oz (72.303 kg)    General: Appears her stated age, overweight but well developed, well nourished in NAD. HEENT: Head: normal shape and size; Eyes: sclera white, no icterus, conjunctiva pink, PERRLA and EOMs intact; Ears: Tm's gray and intact, normal light reflex; Nose: mucosa pink and moist, septum midline; Throat/Mouth: Teeth present, mucosa pink and moist, + PND, no lesions or ulcerations noted.  Neck: Normal range of motion. Neck supple, trachea midline. No massses,  lumps present. thyromegaly noted. Cardiovascular: Normal rate and rhythm. S1,S2 noted.  No murmur, rubs or gallops noted. No JVD or BLE edema. No carotid bruits noted. Pulmonary/Chest: Normal effort and positive vesicular breath sounds. No respiratory distress. No wheezes, rales or ronchi noted.  Abdomen: Soft and nontender. Normal bowel sounds, no bruits noted. No distention or masses noted. Liver, spleen and kidneys non palpable. Musculoskeletal: Normal range of motion. No signs of joint swelling. No difficulty with gait. Tenderness over the right trochanter.  Neurological: Alert and oriented. Cranial nerves II-XII intact. Coordination normal. +DTRs bilaterally. Psychiatric: Mood and affect normal. Behavior is normal. Judgment and thought content normal.      Assessment and Plan:  Preventative Health Maintenance:  Will obtain basic screening labs today Work on diet and exercise Try to find out when your tetanus was Encouraged pt to visit eye doctor and dentist at least annually

## 2012-10-14 NOTE — Patient Instructions (Signed)

## 2012-10-14 NOTE — Assessment & Plan Note (Signed)
Quit Continue cessation

## 2012-10-14 NOTE — Assessment & Plan Note (Signed)
Aleve as needed. 

## 2012-10-14 NOTE — Assessment & Plan Note (Signed)
Continue exercise at the gym Maintain a 1200 calorie diet

## 2012-11-20 ENCOUNTER — Ambulatory Visit (INDEPENDENT_AMBULATORY_CARE_PROVIDER_SITE_OTHER): Payer: Self-pay | Admitting: Obstetrics & Gynecology

## 2012-11-20 ENCOUNTER — Encounter: Payer: Self-pay | Admitting: Obstetrics & Gynecology

## 2012-11-20 VITALS — BP 119/76 | HR 90 | Temp 98.5°F | Ht 60.5 in | Wt 164.6 lb

## 2012-11-20 DIAGNOSIS — D259 Leiomyoma of uterus, unspecified: Secondary | ICD-10-CM

## 2012-11-20 DIAGNOSIS — D219 Benign neoplasm of connective and other soft tissue, unspecified: Secondary | ICD-10-CM

## 2012-11-20 DIAGNOSIS — N920 Excessive and frequent menstruation with regular cycle: Secondary | ICD-10-CM

## 2012-11-20 NOTE — Progress Notes (Signed)
Here today to discuss surgery for continue bleeding issues, and fibroids.

## 2012-11-20 NOTE — Patient Instructions (Signed)
Hysteroscopy Hysteroscopy is a procedure used for looking inside the womb (uterus). It may be done for many different reasons, including:  To evaluate abnormal bleeding, fibroid (benign, noncancerous) tumors, polyps, scar tissue (adhesions), and possibly cancer of the uterus.  To look for lumps (tumors) and other uterine growths.  To look for causes of why a woman cannot get pregnant (infertility), causes of recurrent loss of pregnancy (miscarriages), or a lost intrauterine device (IUD).  To perform a sterilization by blocking the fallopian tubes from inside the uterus. A hysteroscopy should be done right after a menstrual period to be sure you are not pregnant. LET YOUR CAREGIVER KNOW ABOUT:   Allergies.  Medicines taken, including herbs, eyedrops, over-the-counter medicines, and creams.  Use of steroids (by mouth or creams).  Previous problems with anesthetics or numbing medicines.  History of bleeding or blood problems.  History of blood clots.  Possibility of pregnancy, if this applies.  Previous surgery.  Other health problems. RISKS AND COMPLICATIONS   Putting a hole in the uterus.  Excessive bleeding.  Infection.  Damage to the cervix.  Injury to other organs.  Allergic reaction to medicines.  Too much fluid used in the uterus for the procedure. BEFORE THE PROCEDURE   Do not take aspirin or blood thinners for a week before the procedure, or as directed. It can cause bleeding.  Arrive at least 60 minutes before the procedure or as directed to read and sign the necessary forms.  Arrange for someone to take you home after the procedure.  If you smoke, do not smoke for 2 weeks before the procedure. PROCEDURE   Your caregiver may give you medicine to relax you. He or she may also give you a medicine that numbs the area around the cervix (local anesthetic) or a medicine that makes you sleep (general anesthesia).  Sometimes, a medicine is placed in the cervix  the day before the procedure. This medicine makes the cervix have a larger opening (dilate). This makes it easier for the instrument to be inserted into the uterus.  A small instrument (hysteroscope) is inserted through the vagina into the uterus. This instrument is similar to a pencil-sized telescope with a light.  During the procedure, air or a liquid is put into the uterus, which allows the surgeon to see better.  Sometimes, tissue is gently scraped from inside the uterus. These tissue samples are sent to a specialist who looks at tissue samples (pathologist). The pathologist will give a report to your caregiver. This will help your caregiver decide if further treatment is necessary. The report will also help your caregiver decide on the best treatment if the test comes back abnormal. AFTER THE PROCEDURE   If you had a general anesthetic, you may be groggy for a couple hours after the procedure.  If you had a local anesthetic, you will be advised to rest at the surgical center or caregiver's office until you are stable and feel ready to go home.  You may have some cramping for a couple days.  You may have bleeding, which varies from light spotting for a few days to menstrual-like bleeding for up to 3 to 7 days. This is normal.  Have someone take you home. FINDING OUT THE RESULTS OF YOUR TEST Not all test results are available during your visit. If your test results are not back during the visit, make an appointment with your caregiver to find out the results. Do not assume everything is normal if you   have not heard from your caregiver or the medical facility. It is important for you to follow up on all of your test results. HOME CARE INSTRUCTIONS   Do not drive for 24 hours or as instructed.  Only take over-the-counter or prescription medicines for pain, discomfort, or fever as directed by your caregiver.  Do not take aspirin. It can cause or aggravate bleeding.  Do not drive or drink  alcohol while taking pain medicine.  You may resume your usual diet.  Do not use tampons, douche, or have sexual intercourse for 2 weeks, or as advised by your caregiver.  Rest and sleep for the first 24 to 48 hours.  Take your temperature twice a day for 4 to 5 days. Write it down. Give these temperatures to your caregiver if they are abnormal (above 98.6 F or 37.0 C).  Take medicines your caregiver has ordered as directed.  Follow your caregiver's advice regarding diet, exercise, lifting, driving, and general activities.  Take showers instead of baths for 2 weeks, or as recommended by your caregiver.  If you develop constipation:  Take a mild laxative with the advice of your caregiver.  Eat bran foods.  Drink enough water and fluids to keep your urine clear or pale yellow.  Try to have someone with you or available to you for the first 24 to 48 hours, especially if you had a general anesthetic.  Make sure you and your family understand everything about your operation and recovery.  Follow your caregiver's advice regarding follow-up appointments and Pap smears. SEEK MEDICAL CARE IF:   You feel dizzy or lightheaded.  You feel sick to your stomach (nauseous).  You develop abnormal vaginal discharge.  You develop a rash.  You have an abnormal reaction or allergy to your medicine.  You need stronger pain medicine. SEEK IMMEDIATE MEDICAL CARE IF:   Bleeding is heavier than a normal menstrual period or you have blood clots.  You have an oral temperature above 102 F (38.9 C), not controlled by medicine.  You have increasing cramps or pains not relieved with medicine.  You develop belly (abdominal) pain that does not seem to be related to the same area of earlier cramping and pain.  You pass out.  You develop pain in the tops of your shoulders (shoulder strap areas).  You develop shortness of breath. MAKE SURE YOU:   Understand these instructions.  Will watch  your condition.  Will get help right away if you are not doing well or get worse. Document Released: 06/11/2000 Document Revised: 05/28/2011 Document Reviewed: 10/04/2008 [facility] Patient Information 2014 [place], [place].   Hysterectomy Information  A hysterectomy is a procedure where your uterus is surgically removed. It will no longer be possible to have menstrual periods or to become pregnant. The tubes and ovaries can be removed (bilateral salpingo-oopherectomy) during this surgery as well.  REASONS FOR A HYSTERECTOMY  Persistent, abnormal bleeding.  Lasting (chronic) pelvic pain or infection.  The lining of the uterus (endometrium) starts growing outside the uterus (endometriosis).  The endometrium starts growing in the muscle of the uterus (adenomyosis).  The uterus falls down into the vagina (pelvic organ prolapse).  Symptomatic uterine fibroids.  Precancerous cells.  Cervical cancer or uterine cancer. TYPES OF HYSTERECTOMIES  Supracervical hysterectomy. This type removes the top part of the uterus, but not the cervix.  Total hysterectomy. This type removes the uterus and cervix.  Radical hysterectomy. This type removes the uterus, cervix, and the fibrous tissue  that holds the uterus in place in the pelvis (parametrium). WAYS A HYSTERECTOMY CAN BE PERFORMED  Abdominal hysterectomy. A large surgical cut (incision) is made in the abdomen. The uterus is removed through this incision.  Vaginal hysterectomy. An incision is made in the vagina. The uterus is removed through this incision. There are no abdominal incisions.  Conventional laparoscopic hysterectomy. A thin, lighted tube with a camera (laparoscope) is inserted into 3 or 4 small incisions in the abdomen. The uterus is cut into small pieces. The small pieces are removed through the incisions, or they are removed through the vagina.  Laparoscopic assisted vaginal hysterectomy (LAVH). Three or four small incisions are  made in the abdomen. Part of the surgery is performed laparoscopically and part vaginally. The uterus is removed through the vagina.  Robot-assisted laparoscopic hysterectomy. A laparoscope is inserted into 3 or 4 small incisions in the abdomen. A computer-controlled device is used to give the surgeon a 3D image. This allows for more precise movements of surgical instruments. The uterus is cut into small pieces and removed through the incisions or removed through the vagina. RISKS OF HYSTERECTOMY   Bleeding and risk of blood transfusion. Tell your caregiver if you do not want to receive any blood products.  Blood clots in the legs or lung.  Infection.  Injury to surrounding organs.  Anesthesia problems or side effects.  Conversion to an abdominal hysterectomy. WHAT TO EXPECT AFTER A HYSTERECTOMY  You will be given pain medicine.  You will need to have someone with you for the first 3 to 5 days after you go home.  You will need to follow up with your surgeon in 2 to 4 weeks after surgery to evaluate your progress.  You may have early menopause symptoms like hot flashes, night sweats, and insomnia.  If you had a hysterectomy for a problem that was not a cancer or a condition that could lead to cancer, then you no longer need Pap tests. However, even if you no longer need a Pap test, a regular exam is a good idea to make sure no other problems are starting. Document Released: 08/29/2000 Document Revised: 05/28/2011 Document Reviewed: 10/14/2010 [facility] Patient Information 2014 [place], [place].

## 2012-11-21 NOTE — Progress Notes (Signed)
GYNECOLOGY CLINIC ENCOUNTER NOTE  History:  47 y.o. [id number] here today for review of recent pelvic ultrasound and discussion about management. She still reports heavy bleeding occasionally associated with some pain. No other symptoms.  The following portions of the patient's history were reviewed and updated as appropriate: allergies, current medications, past family history, past medical history, past social history, past surgical history and problem list. Normal pap smear and negative HRHPV on 08/15/12. Normal mammogram on 05/21/12.  Review of Systems:  Pertinent items are noted in HPI.  Objective:  BP 119/76  Pulse 90  Temp(Src) 98.5 F (36.9 C)  Ht 5' 0.5" (1.537 m)  Wt 164 lb 9.6 oz (74.662 kg)  BMI 31.6 kg/m2  LMP 11/03/2012 Physical Exam deferred  Labs and Imaging 08/22/2012   TRANSABDOMINAL AND TRANSVAGINAL ULTRASOUND OF PELVIS  Clinical Data: Menses following 6 months of amenorrhea.  Known fibroids.  Maternal history of cervical cancer.  LMP 08/01/2012  Technique:  Both transabdominal and transvaginal ultrasound examinations of the pelvis were performed. Transabdominal technique was performed for global imaging of the pelvis including uterus, ovaries, adnexal regions, and pelvic cul-de-sac.  It was necessary to proceed with endovaginal exam following the transabdominal exam to visualize the myometrium, endometrium and adnexa.  Comparison:  08/18/2010  Findings:  Uterus: Is anteverted and anteflexed and demonstrates a sagittal length of 8.7 cm, depth of 4.6 cm and width of 5.2 cm.  Several fibroids are identified including a subserosal fibroid in the left cornual region measuring 2.5 x 2.2 x 1.8 cm and submucosal fibroid in the anterior midbody measuring 10 x 9 x 8 mm.  Two small focal mural fibroids are identified in the posterior upper uterine segment measuring 10 x 8 x 11 mm and in the posterior upper uterine segment measuring 11 x 14 x 13 mm.  Normal cervical appearance.  Endometrium: Is  homogeneously echogenic with a width of 7.5 mm. The mid uterine portion of the endometrium is distorted by the presence of the submucosal fibroid.  Right ovary:  Measures 1.8 x 1.5 x 1.2 cm and has a normal appearance  Left ovary: Measures 2.3 x 1.8 x 1.8 cm and contains a collapsing corpus luteum  Other findings: No pelvic fluid or separate adnexal masses are seen.  IMPRESSION: Focal fibroids with one fibroid appearing to be primarily submucosal and a second to be primarily subserosal as described above. The degree of submucosal involvement can be more fully assessed with sonohysterography if desired.  Endometrial and ovarian appearance is otherwise suggestive of a presecretory phase of cycle and this would correlate with the expected appearance given the provided  LMP of 08/01/2012.   Original Report Authenticated By: Rhodia Albright, M.D.   Assessment & Plan:  Discussed hysteroscopic myomectomy vs hysterectomy, did not offer abdominal myomectomy/laparoscopic myomectomy as the patient is not interested in these modalities.  Risks and benefits of both modalities discussed in detail.  Patient is leaning towards hysterectomy; she will be a good candidate for TVH.  Discussed this type of hysterectomy in detail, all questions answered.  Patient is still unsure, she wants to pray about this and come up with a decision in the next week.  She as told to call the office to speak with a RN when she makes her decision; and the RN will inform me so that I can proceed with sending in a surgery posting order.  Bleeding and pain precautions reviewed.

## 2012-12-25 ENCOUNTER — Encounter (HOSPITAL_COMMUNITY)
Admission: RE | Admit: 2012-12-25 | Discharge: 2012-12-25 | Disposition: A | Discharge status: Home / Self Care | Payer: No Typology Code available for payment source | Source: Ambulatory Visit | Attending: Obstetrics & Gynecology | Admitting: Obstetrics & Gynecology

## 2012-12-25 ENCOUNTER — Encounter: Payer: Self-pay | Admitting: Obstetrics & Gynecology

## 2012-12-25 ENCOUNTER — Encounter (HOSPITAL_COMMUNITY): Payer: Self-pay

## 2012-12-25 ENCOUNTER — Ambulatory Visit (INDEPENDENT_AMBULATORY_CARE_PROVIDER_SITE_OTHER): Payer: Self-pay | Admitting: Obstetrics & Gynecology

## 2012-12-25 VITALS — BP 115/70 | HR 92 | Temp 97.2°F | Ht 61.0 in | Wt 167.5 lb

## 2012-12-25 DIAGNOSIS — Z01812 Encounter for preprocedural laboratory examination: Secondary | ICD-10-CM | POA: Insufficient documentation

## 2012-12-25 DIAGNOSIS — Z01818 Encounter for other preprocedural examination: Secondary | ICD-10-CM

## 2012-12-25 LAB — CBC
MCH: 28.2 pg (ref 26.0–34.0)
Platelets: 330 10*3/uL (ref 150–400)
RBC: 4.51 MIL/uL (ref 3.87–5.11)

## 2012-12-25 NOTE — Patient Instructions (Signed)
Hysterectomy Information  A hysterectomy is a procedure where your uterus is surgically removed. It will no longer be possible to have menstrual periods or to become pregnant. The tubes and ovaries can be removed (bilateral salpingo-oopherectomy) during this surgery as well.  REASONS FOR A HYSTERECTOMY  Persistent, abnormal bleeding.  Lasting (chronic) pelvic pain or infection.  The lining of the uterus (endometrium) starts growing outside the uterus (endometriosis).  The endometrium starts growing in the muscle of the uterus (adenomyosis).  The uterus falls down into the vagina (pelvic organ prolapse).  Symptomatic uterine fibroids.  Precancerous cells.  Cervical cancer or uterine cancer. TYPES OF HYSTERECTOMIES  Supracervical hysterectomy. This type removes the top part of the uterus, but not the cervix.  Total hysterectomy. This type removes the uterus and cervix.  Radical hysterectomy. This type removes the uterus, cervix, and the fibrous tissue that holds the uterus in place in the pelvis (parametrium). WAYS A HYSTERECTOMY CAN BE PERFORMED  Abdominal hysterectomy. A large surgical cut (incision) is made in the abdomen. The uterus is removed through this incision.  Vaginal hysterectomy. An incision is made in the vagina. The uterus is removed through this incision. There are no abdominal incisions.  Conventional laparoscopic hysterectomy. A thin, lighted tube with a camera (laparoscope) is inserted into 3 or 4 small incisions in the abdomen. The uterus is cut into small pieces. The small pieces are removed through the incisions, or they are removed through the vagina.  Laparoscopic assisted vaginal hysterectomy (LAVH). Three or four small incisions are made in the abdomen. Part of the surgery is performed laparoscopically and part vaginally. The uterus is removed through the vagina.  Robot-assisted laparoscopic hysterectomy. A laparoscope is inserted into 3 or 4 small  incisions in the abdomen. A computer-controlled device is used to give the surgeon a 3D image. This allows for more precise movements of surgical instruments. The uterus is cut into small pieces and removed through the incisions or removed through the vagina. RISKS OF HYSTERECTOMY   Bleeding and risk of blood transfusion. Tell your caregiver if you do not want to receive any blood products.  Blood clots in the legs or lung.  Infection.  Injury to surrounding organs.  Anesthesia problems or side effects.  Conversion to an abdominal hysterectomy. WHAT TO EXPECT AFTER A HYSTERECTOMY  You will be given pain medicine.  You will need to have someone with you for the first 3 to 5 days after you go home.  You will need to follow up with your surgeon in 2 to 4 weeks after surgery to evaluate your progress.  You may have early menopause symptoms like hot flashes, night sweats, and insomnia.  If you had a hysterectomy for a problem that was not a cancer or a condition that could lead to cancer, then you no longer need Pap tests. However, even if you no longer need a Pap test, a regular exam is a good idea to make sure no other problems are starting. Document Released: 08/29/2000 Document Revised: 05/28/2011 Document Reviewed: 10/14/2010 Exit[employer] Patient Information 2014 [employer].  

## 2012-12-25 NOTE — Patient Instructions (Addendum)
Your procedure is scheduled on:01/05/13  Enter through the Main Entrance at :8am Pick up desk phone and dial [phone] and inform us of your arrival.  Please call [phone] if you have any problems the morning of surgery.  Remember: Do not eat food or drink liquids, including water, after midnight:Sunday  water ok until:5:30 am   You may brush your teeth the morning of surgery.  Take these meds the morning of surgery with a sip of water:bring inhaler  DO NOT wear jewelry, eye make-up, lipstick,body lotion, or dark fingernail polish.  (Polished toes are ok) You may wear deodorant.  If you are to be admitted after surgery, leave suitcase in car until your room has been assigned. Patients discharged on the day of surgery will not be allowed to drive home. Wear loose fitting, comfortable clothes for your ride home.

## 2012-12-25 NOTE — Progress Notes (Signed)
GYNECOLOGY CLINIC ENCOUNTER NOTE  History:  47 y.o. [id number] here today for preoperative appointment for TVH scheduled on 01/05/13.    The following portions of the patient's history were reviewed and updated as appropriate: allergies, current medications, past family history, past medical history, past social history, past surgical history and problem list.  Review of Systems:  Pertinent items are noted in HPI.  Objective:  BP 115/70  Pulse 92  Temp(Src) 97.2 F (36.2 C)  Ht 5' 1" (1.549 m)  Wt 167 lb 8 oz (75.978 kg)  BMI 31.67 kg/m2  LMP 12/19/2012 Physical Exam deferred  Assessment & Plan:  Patient is scheduled for transvaginal hysterectomy (TVH).  The risks of surgery were discussed in detail with the patient including but not limited to: bleeding which may require transfusion or reoperation; infection which may require antibiotics; injury to bowel, bladder, ureters or other surrounding organs; need for additional procedures including laparotomy; thromboembolic phenomenon, incisional problems and other postoperative/anesthesia complications.  Patient was also advised that she will remain in house for 1 day; and expected recovery time after a hysterectomy is 6-8 weeks.  Likelihood of success in alleviating the patient's symptoms was discussed. Routine postoperative instructions will be reviewed with the patient and her family in detail after surgery.  Routine preoperative instructions of having nothing to eat or drink after midnight on the day prior to surgery and also coming to the hospital 1 1/2 hours prior to her time of surgery were also emphasized.  Printed patient education handouts about the procedure was given to the patient to review at home.  

## 2012-12-29 ENCOUNTER — Encounter (HOSPITAL_COMMUNITY): Payer: Self-pay | Admitting: Pharmacist

## 2013-01-05 ENCOUNTER — Encounter (HOSPITAL_COMMUNITY)
Admission: RE | Disposition: A | Discharge status: Home / Self Care | Payer: Self-pay | Source: Ambulatory Visit | Attending: Obstetrics & Gynecology

## 2013-01-05 ENCOUNTER — Encounter (HOSPITAL_COMMUNITY): Payer: Self-pay | Admitting: *Deleted

## 2013-01-05 ENCOUNTER — Encounter (HOSPITAL_COMMUNITY): Payer: Self-pay | Admitting: Anesthesiology

## 2013-01-05 ENCOUNTER — Observation Stay (HOSPITAL_COMMUNITY)
Admission: RE | Admit: 2013-01-05 | Discharge: 2013-01-06 | Disposition: A | Discharge status: Home / Self Care | Payer: Self-pay | Source: Ambulatory Visit | Attending: Obstetrics & Gynecology | Admitting: Obstetrics & Gynecology

## 2013-01-05 ENCOUNTER — Inpatient Hospital Stay (HOSPITAL_COMMUNITY): Payer: No Typology Code available for payment source | Admitting: Anesthesiology

## 2013-01-05 DIAGNOSIS — Z23 Encounter for immunization: Secondary | ICD-10-CM | POA: Insufficient documentation

## 2013-01-05 DIAGNOSIS — Z90710 Acquired absence of both cervix and uterus: Secondary | ICD-10-CM | POA: Diagnosis not present

## 2013-01-05 DIAGNOSIS — D252 Subserosal leiomyoma of uterus: Secondary | ICD-10-CM | POA: Insufficient documentation

## 2013-01-05 DIAGNOSIS — N920 Excessive and frequent menstruation with regular cycle: Principal | ICD-10-CM | POA: Diagnosis present

## 2013-01-05 DIAGNOSIS — N938 Other specified abnormal uterine and vaginal bleeding: Secondary | ICD-10-CM | POA: Insufficient documentation

## 2013-01-05 DIAGNOSIS — D259 Leiomyoma of uterus, unspecified: Secondary | ICD-10-CM

## 2013-01-05 DIAGNOSIS — D219 Benign neoplasm of connective and other soft tissue, unspecified: Secondary | ICD-10-CM | POA: Diagnosis present

## 2013-01-05 DIAGNOSIS — N8000 Endometriosis of the uterus, unspecified: Secondary | ICD-10-CM | POA: Insufficient documentation

## 2013-01-05 DIAGNOSIS — D251 Intramural leiomyoma of uterus: Secondary | ICD-10-CM | POA: Insufficient documentation

## 2013-01-05 DIAGNOSIS — N949 Unspecified condition associated with female genital organs and menstrual cycle: Secondary | ICD-10-CM | POA: Insufficient documentation

## 2013-01-05 HISTORY — PX: VAGINAL HYSTERECTOMY: SHX2639

## 2013-01-05 LAB — TYPE AND SCREEN

## 2013-01-05 LAB — CBC
MCH: 28.4 pg (ref 26.0–34.0)
MCV: 85 fL (ref 78.0–100.0)
Platelets: 303 10*3/uL (ref 150–400)
RDW: 13.5 % (ref 11.5–15.5)

## 2013-01-05 LAB — PREGNANCY, URINE: Preg Test, Ur: NEGATIVE

## 2013-01-05 SURGERY — HYSTERECTOMY, VAGINAL
Anesthesia: Spinal | Site: Vagina | Wound class: Clean Contaminated

## 2013-01-05 MED ORDER — ESTRADIOL 0.1 MG/GM VA CREA
TOPICAL_CREAM | VAGINAL | Status: AC
  Filled 2013-01-05: qty 42.5

## 2013-01-05 MED ORDER — LIDOCAINE-EPINEPHRINE 1 %-1:100000 IJ SOLN
INTRAMUSCULAR | Status: AC
  Filled 2013-01-05: qty 1

## 2013-01-05 MED ORDER — ZOLPIDEM TARTRATE 5 MG PO TABS
5.0000 mg | ORAL_TABLET | Freq: Every evening | ORAL | Status: DC | PRN
  Administered 2013-01-05: 5 mg via ORAL
  Filled 2013-01-05: qty 1

## 2013-01-05 MED ORDER — PHENYLEPHRINE HCL 10 MG/ML IJ SOLN
INTRAMUSCULAR | Status: AC
  Filled 2013-01-05: qty 1

## 2013-01-05 MED ORDER — LIDOCAINE HCL (CARDIAC) 20 MG/ML IV SOLN
INTRAVENOUS | Status: AC
  Filled 2013-01-05: qty 5

## 2013-01-05 MED ORDER — PROMETHAZINE HCL 25 MG/ML IJ SOLN: 6.2500 mg | INTRAMUSCULAR | Status: DC | PRN

## 2013-01-05 MED ORDER — ONDANSETRON HCL 4 MG PO TABS: 4.0000 mg | ORAL_TABLET | Freq: Four times a day (QID) | ORAL | Status: DC | PRN

## 2013-01-05 MED ORDER — ONDANSETRON HCL 4 MG/2ML IJ SOLN: 4.0000 mg | Freq: Four times a day (QID) | INTRAMUSCULAR | Status: DC | PRN

## 2013-01-05 MED ORDER — ADULT MULTIVITAMIN W/MINERALS CH
1.0000 | ORAL_TABLET | Freq: Every day | ORAL | Status: DC
  Administered 2013-01-06: 1 via ORAL
  Filled 2013-01-05 (×3): qty 1

## 2013-01-05 MED ORDER — ALBUTEROL SULFATE HFA 108 (90 BASE) MCG/ACT IN AERS
2.0000 | INHALATION_SPRAY | Freq: Four times a day (QID) | RESPIRATORY_TRACT | Status: DC | PRN
  Filled 2013-01-05: qty 6.7

## 2013-01-05 MED ORDER — MENTHOL 3 MG MT LOZG: 1.0000 | LOZENGE | OROMUCOSAL | Status: DC | PRN

## 2013-01-05 MED ORDER — PANTOPRAZOLE SODIUM 40 MG PO TBEC
40.0000 mg | DELAYED_RELEASE_TABLET | Freq: Every day | ORAL | Status: DC
  Administered 2013-01-06: 40 mg via ORAL
  Filled 2013-01-05 (×3): qty 1

## 2013-01-05 MED ORDER — CEFAZOLIN SODIUM-DEXTROSE 2-3 GM-% IV SOLR
2.0000 g | INTRAVENOUS | Status: AC
Start: 2013-01-05 — End: 2013-01-05
  Administered 2013-01-05: 2 g via INTRAVENOUS

## 2013-01-05 MED ORDER — INFLUENZA VAC SPLIT QUAD 0.5 ML IM SUSP
0.5000 mL | INTRAMUSCULAR | Status: AC
  Administered 2013-01-06: 0.5 mL via INTRAMUSCULAR
  Filled 2013-01-05: qty 0.5

## 2013-01-05 MED ORDER — SIMETHICONE 80 MG PO CHEW: 80.0000 mg | CHEWABLE_TABLET | Freq: Four times a day (QID) | ORAL | Status: DC | PRN

## 2013-01-05 MED ORDER — PHENYLEPHRINE HCL 10 MG/ML IJ SOLN
10.0000 mg | INTRAVENOUS | Status: DC | PRN
  Administered 2013-01-05: 25 ug/min via INTRAVENOUS

## 2013-01-05 MED ORDER — BUPIVACAINE IN DEXTROSE 0.75-8.25 % IT SOLN
INTRATHECAL | Status: DC | PRN
  Administered 2013-01-05: 1.8 mL via INTRATHECAL

## 2013-01-05 MED ORDER — KETOROLAC TROMETHAMINE 30 MG/ML IJ SOLN
30.0000 mg | Freq: Once | INTRAMUSCULAR | Status: AC
  Administered 2013-01-05: 30 mg via INTRAVENOUS

## 2013-01-05 MED ORDER — LIDOCAINE HCL (CARDIAC) 20 MG/ML IV SOLN
INTRAVENOUS | Status: DC | PRN
  Administered 2013-01-05 (×3): 30 mg via INTRAVENOUS

## 2013-01-05 MED ORDER — LACTATED RINGERS IV SOLN: INTRAVENOUS | Status: DC

## 2013-01-05 MED ORDER — OXYCODONE-ACETAMINOPHEN 5-325 MG PO TABS
1.0000 | ORAL_TABLET | ORAL | Status: DC | PRN
Start: 2013-01-05 — End: 2013-01-06
  Administered 2013-01-05 – 2013-01-06 (×2): 1 via ORAL
  Filled 2013-01-05 (×2): qty 1

## 2013-01-05 MED ORDER — ESTROGENS, CONJUGATED 0.625 MG/GM VA CREA
TOPICAL_CREAM | VAGINAL | Status: DC | PRN
  Administered 2013-01-05: 1 via VAGINAL

## 2013-01-05 MED ORDER — MIDAZOLAM HCL 2 MG/2ML IJ SOLN: 0.5000 mg | Freq: Once | INTRAMUSCULAR | Status: DC | PRN

## 2013-01-05 MED ORDER — HYDROMORPHONE HCL PF 1 MG/ML IJ SOLN
0.2000 mg | INTRAMUSCULAR | Status: DC | PRN
  Administered 2013-01-05 (×2): 0.6 mg via INTRAVENOUS
  Filled 2013-01-05 (×2): qty 1

## 2013-01-05 MED ORDER — PROPOFOL 10 MG/ML IV EMUL
INTRAVENOUS | Status: AC
  Filled 2013-01-05: qty 20

## 2013-01-05 MED ORDER — IBUPROFEN 800 MG PO TABS
800.0000 mg | ORAL_TABLET | Freq: Three times a day (TID) | ORAL | Status: DC | PRN
  Administered 2013-01-05 – 2013-01-06 (×2): 800 mg via ORAL
  Filled 2013-01-05 (×2): qty 1

## 2013-01-05 MED ORDER — KETOROLAC TROMETHAMINE 30 MG/ML IJ SOLN
INTRAMUSCULAR | Status: AC
  Filled 2013-01-05: qty 1

## 2013-01-05 MED ORDER — LIDOCAINE-EPINEPHRINE 1 %-1:100000 IJ SOLN
INTRAMUSCULAR | Status: DC | PRN
  Administered 2013-01-05: 25 mL

## 2013-01-05 MED ORDER — LACTATED RINGERS IV SOLN
INTRAVENOUS | Status: DC
  Administered 2013-01-05 (×2): via INTRAVENOUS

## 2013-01-05 MED ORDER — MEPERIDINE HCL 25 MG/ML IJ SOLN: 6.2500 mg | INTRAMUSCULAR | Status: DC | PRN

## 2013-01-05 MED ORDER — KETOROLAC TROMETHAMINE 30 MG/ML IJ SOLN: 15.0000 mg | Freq: Once | INTRAMUSCULAR | Status: DC | PRN

## 2013-01-05 MED ORDER — MIDAZOLAM HCL 2 MG/2ML IJ SOLN
INTRAMUSCULAR | Status: AC
  Filled 2013-01-05: qty 2

## 2013-01-05 MED ORDER — FENTANYL CITRATE 0.05 MG/ML IJ SOLN
INTRAMUSCULAR | Status: DC | PRN
  Administered 2013-01-05 (×2): 50 ug via INTRAVENOUS

## 2013-01-05 MED ORDER — CEFAZOLIN SODIUM-DEXTROSE 2-3 GM-% IV SOLR
INTRAVENOUS | Status: AC
  Filled 2013-01-05: qty 50

## 2013-01-05 MED ORDER — PROPOFOL INFUSION 10 MG/ML OPTIME
INTRAVENOUS | Status: DC | PRN
  Administered 2013-01-05: 75 ug/kg/min via INTRAVENOUS

## 2013-01-05 MED ORDER — MIDAZOLAM HCL 2 MG/2ML IJ SOLN
INTRAMUSCULAR | Status: DC | PRN
  Administered 2013-01-05 (×2): 1 mg via INTRAVENOUS

## 2013-01-05 MED ORDER — FENTANYL CITRATE 0.05 MG/ML IJ SOLN
INTRAMUSCULAR | Status: AC
  Filled 2013-01-05: qty 2

## 2013-01-05 MED ORDER — PNEUMOCOCCAL VAC POLYVALENT 25 MCG/0.5ML IJ INJ
0.5000 mL | INJECTION | INTRAMUSCULAR | Status: AC
  Administered 2013-01-06: 0.5 mL via INTRAMUSCULAR
  Filled 2013-01-05: qty 0.5

## 2013-01-05 MED ORDER — DOCUSATE SODIUM 100 MG PO CAPS
100.0000 mg | ORAL_CAPSULE | Freq: Two times a day (BID) | ORAL | Status: DC
  Administered 2013-01-06: 100 mg via ORAL
  Filled 2013-01-05: qty 1

## 2013-01-05 MED ORDER — ONDANSETRON HCL 4 MG/2ML IJ SOLN
INTRAMUSCULAR | Status: AC
  Filled 2013-01-05: qty 2

## 2013-01-05 MED ORDER — HYDROMORPHONE HCL PF 1 MG/ML IJ SOLN: 0.2500 mg | INTRAMUSCULAR | Status: DC | PRN

## 2013-01-05 MED ORDER — LACTATED RINGERS IV SOLN
INTRAVENOUS | Status: DC | PRN
  Administered 2013-01-05 (×2): via INTRAVENOUS

## 2013-01-05 SURGICAL SUPPLY — 28 items
CANISTER SUCT 3000ML (MISCELLANEOUS) ×2 IMPLANT
CLOTH BEACON ORANGE TIMEOUT ST (SAFETY) ×2 IMPLANT
CONT PATH 16OZ SNAP LID 3702 (MISCELLANEOUS) IMPLANT
DECANTER SPIKE VIAL GLASS SM (MISCELLANEOUS) IMPLANT
DRAPE STERI URO 9X17 APER PCH (DRAPES) ×1 IMPLANT
DRESSING TELFA 8X3 (GAUZE/BANDAGES/DRESSINGS) ×2 IMPLANT
ELECT LIGASURE LONG (ELECTRODE) IMPLANT
GAUZE PACKING 1/2 X5 YD (GAUZE/BANDAGES/DRESSINGS) ×2 IMPLANT
GAUZE PACKING 2X5 YD STERILE (GAUZE/BANDAGES/DRESSINGS) IMPLANT
GLOVE BIO SURGEON STRL SZ7 (GLOVE) ×2 IMPLANT
GLOVE BIOGEL PI IND STRL 6.5 (GLOVE) ×1 IMPLANT
GLOVE BIOGEL PI IND STRL 7.0 (GLOVE) ×1 IMPLANT
GLOVE BIOGEL PI INDICATOR 6.5 (GLOVE) ×1
GLOVE BIOGEL PI INDICATOR 7.0 (GLOVE) ×1
GOWN STRL REIN XL XLG (GOWN DISPOSABLE) ×8 IMPLANT
NEEDLE HYPO 22GX1.5 SAFETY (NEEDLE) IMPLANT
NEEDLE SPNL 22GX3.5 QUINCKE BK (NEEDLE) IMPLANT
NS IRRIG 1000ML POUR BTL (IV SOLUTION) ×2 IMPLANT
PACK VAGINAL WOMENS (CUSTOM PROCEDURE TRAY) ×2 IMPLANT
PAD OB MATERNITY 4.3X12.25 (PERSONAL CARE ITEMS) ×2 IMPLANT
SUT VIC AB 0 CT1 18XCR BRD8 (SUTURE) ×3 IMPLANT
SUT VIC AB 0 CT1 27 (SUTURE) ×4
SUT VIC AB 0 CT1 27XBRD ANBCTR (SUTURE) ×2 IMPLANT
SUT VIC AB 0 CT1 8-18 (SUTURE) ×6
SUT VICRYL 0 TIES 12 18 (SUTURE) ×2 IMPLANT
TOWEL OR 17X24 6PK STRL BLUE (TOWEL DISPOSABLE) ×4 IMPLANT
TRAY FOLEY CATH 14FR (SET/KITS/TRAYS/PACK) ×2 IMPLANT
WATER STERILE IRR 1000ML POUR (IV SOLUTION) ×2 IMPLANT

## 2013-01-05 NOTE — H&P (View-Only) (Signed)
GYNECOLOGY CLINIC ENCOUNTER NOTE  History:  47 y.o. [id number] here today for preoperative appointment for [facility] scheduled on 01/05/13.    The following portions of the patient's history were reviewed and updated as appropriate: allergies, current medications, past family history, past medical history, past social history, past surgical history and problem list.  Review of Systems:  Pertinent items are noted in HPI.  Objective:  BP 115/70  Pulse 92  Temp(Src) 97.2 F (36.2 C)  Ht 5\' 1"  (1.549 m)  Wt 167 lb 8 oz (75.978 kg)  BMI 31.67 kg/m2  LMP 12/19/2012 Physical Exam deferred  Assessment & Plan:  Patient is scheduled for transvaginal hysterectomy (TVH).  The risks of surgery were discussed in detail with the patient including but not limited to: bleeding which may require transfusion or reoperation; infection which may require antibiotics; injury to bowel, bladder, ureters or other surrounding organs; need for additional procedures including laparotomy; thromboembolic phenomenon, incisional problems and other postoperative/anesthesia complications.  Patient was also advised that she will remain in house for 1 day; and expected recovery time after a hysterectomy is 6-8 weeks.  Likelihood of success in alleviating the patient's symptoms was discussed. Routine postoperative instructions will be reviewed with the patient and her family in detail after surgery.  Routine preoperative instructions of having nothing to eat or drink after midnight on the day prior to surgery and also coming to the hospital 1 1/2 hours prior to her time of surgery were also emphasized.  Printed patient education handouts about the procedure was given to the patient to review at home.

## 2013-01-05 NOTE — Interval H&P Note (Signed)
History and Physical Interval Note  Sarah Tyler  has presented today for surgery, with the diagnosis of DYSFUNCTIONAL UTERINE BLEEDING and FIBROIDS.  The various methods of treatment have been discussed with the patient and family. After consideration of risks, benefits and other options for treatment, the patient has consented to TOTAL VAGINAL HYSTERECTOMY as a surgical intervention.  The patient's history has been reviewed, patient examined, no change in status, stable for surgery.  I have reviewed the patient's chart and labs.  Questions were answered to the patient's satisfaction.  To OR when ready.  Tereso Newcomer, MD 01/05/2013 8:51 AM

## 2013-01-05 NOTE — Anesthesia Postprocedure Evaluation (Signed)
  Anesthesia Post Note  Patient: Sarah Tyler  Procedure(s) Performed: Procedure(s) (LRB): TOTAL VAGINAL HYSTERECTOMY  (N/A)  Anesthesia type: GA  Patient location: PACU  Post pain: Pain level controlled  Post assessment: Post-op Vital signs reviewed  Last Vitals:  Filed Vitals:   01/05/13 1215  BP: 109/77  Pulse: 79  Temp:   Resp: 20    Post vital signs: Reviewed  Level of consciousness: sedated  Complications: No apparent anesthesia complications

## 2013-01-05 NOTE — Anesthesia Preprocedure Evaluation (Addendum)
Anesthesia Evaluation  Patient identified by MRN, date of birth, ID band Patient awake    Reviewed: Allergy & Precautions, H&P , Patient's Chart, lab work & pertinent test results, reviewed documented beta blocker date and time   History of Anesthesia Complications Negative for: history of anesthetic complications  Airway Mallampati: II TM Distance: >3 FB Neck ROM: full    Dental no notable dental hx.    Pulmonary neg pulmonary ROS,  breath sounds clear to auscultation  Pulmonary exam normal       Cardiovascular Exercise Tolerance: Good negative cardio ROS  Rhythm:regular Rate:Normal     Neuro/Psych negative neurological ROS  negative psych ROS   GI/Hepatic negative GI ROS, Neg liver ROS,   Endo/Other  negative endocrine ROS  Renal/GU negative Renal ROS     Musculoskeletal   Abdominal   Peds  Hematology negative hematology ROS (+)   Anesthesia Other Findings   Reproductive/Obstetrics negative OB ROS                           Anesthesia Physical Anesthesia Plan  ASA: II  Anesthesia Plan: Spinal   Post-op Pain Management:    Induction:   Airway Management Planned:   Additional Equipment:   Intra-op Plan:   Post-operative Plan:   Informed Consent: I have reviewed the patients History and Physical, chart, labs and discussed the procedure including the risks, benefits and alternatives for the proposed anesthesia with the patient or authorized representative who has indicated his/her understanding and acceptance.   Dental Advisory Given  Plan Discussed with: CRNA and Surgeon  Anesthesia Plan Comments:        Anesthesia Quick Evaluation

## 2013-01-05 NOTE — Anesthesia Procedure Notes (Signed)
Spinal  Patient location during procedure: OR Start time: 01/05/2013 9:55 AM Staffing Anesthesiologist: Angus Seller., Harrell Gave. Performed by: anesthesiologist  Preanesthetic Checklist Completed: patient identified, site marked, surgical consent, pre-op evaluation, timeout performed, IV checked, risks and benefits discussed and monitors and equipment checked Spinal Block Patient position: sitting Prep: DuraPrep Patient monitoring: heart rate, cardiac monitor, continuous pulse ox and blood pressure Approach: midline Location: L3-4 Injection technique: single-shot Needle Needle type: Sprotte  Needle gauge: 24 G Needle length: 9 cm Assessment Sensory level: T4 Additional Notes Patient identified.  Risk benefits discussed including failed block, incomplete pain control, headache, nerve damage, paralysis, blood pressure changes, nausea, vomiting, reactions to medication both toxic or allergic, and postpartum back pain.  Patient expressed understanding and wished to proceed.  All questions were answered.  Sterile technique used throughout procedure.  CSF was clear.  No parasthesia or other complications.  Please see nursing notes for vital signs.

## 2013-01-05 NOTE — Transfer of Care (Signed)
Immediate Anesthesia Transfer of Care Note  Patient: Sarah Tyler  Procedure(s) Performed: Procedure(s): TOTAL VAGINAL HYSTERECTOMY  (N/A)  Patient Location: PACU  Anesthesia Type:Spinal  Level of Consciousness: awake, oriented and patient cooperative  Airway & Oxygen Therapy: Patient Spontanous Breathing  Post-op Assessment: Report given to PACU RN and Post -op Vital signs reviewed and stable  Post vital signs: Reviewed and stable  Complications: No apparent anesthesia complications

## 2013-01-05 NOTE — Transfer of Care (Signed)
Immediate Anesthesia Transfer of Care Note  Patient: Sarah Tyler  Procedure(s) Performed: Procedure(s): TOTAL VAGINAL HYSTERECTOMY  (N/A)  Patient Location: PACU  Anesthesia Type:Spinal  Level of Consciousness: awake, alert  and patient cooperative  Airway & Oxygen Therapy: Patient Spontanous Breathing  Post-op Assessment: Report given to PACU RN and Post -op Vital signs reviewed and stable  Post vital signs: Reviewed and stable  Complications: No apparent anesthesia complications

## 2013-01-05 NOTE — Op Note (Signed)
[facility] PROCEDURE DATE: 01/05/2013  PREOPERATIVE DIAGNOSES:  Menorrhagia, fibroids,  POSTOPERATIVE DIAGNOSIS:  Menorrhagia, fibroids SURGEON:   Jaynie Collins, M.D. ASSISTANT: Willodean Rosenthal, M.D. OPERATION:  Total Vaginal Hysterectomy ANESTHESIA:  Spinal  INDICATIONS: The patient is a 47 y.o. [id number] with history of uterine fibroids and menorrhagia. The patient made a decision to undergo definite surgical treatment. On the preoperative visit, the risks, benefits, indications, and alternatives of the procedure were reviewed with the patient.  On the day of surgery, the risks of surgery were again discussed with the patient including but not limited to: bleeding which may require transfusion or reoperation; infection which may require antibiotics; injury to bowel, bladder, ureters or other surrounding organs; need for additional procedures; thromboembolic phenomenon, incisional problems and other postoperative/anesthesia complications. Written informed consent was obtained.    OPERATIVE FINDINGS: A 9 week size fibroid uterus with normal tubes and ovaries bilaterally.  ESTIMATED BLOOD LOSS: 200 ml FLUIDS:  1600 ml of Lactated Ringers URINE OUTPUT:  200 ml of clear yellow urine. SPECIMENS:  Uterus and cervix sent to pathology COMPLICATIONS:  None immediate.  DESCRIPTION OF PROCEDURE:  The patient received intravenous antibiotics and had sequential compression devices applied to her lower extremities while in the preoperative area.  She was then taken to the operating room where general anesthesia was administered and was found to be adequate.  She was placed in the dorsal lithotomy position, and was prepped and draped in a sterile manner.  A Foley catheter was inserted into her bladder and attached to constant drainage. After an adequate timeout was performed, attention was turned to her pelvis.  A weighted speculum was then placed in the vagina, and the anterior and posterior lips of  the cervix were grasped bilaterally with tenaculums.  The cervix was then injected circumferentially with 1% Lidocaine with epinephrine solution to maintain hemostasis.  The cervix was then circumferentially incised, and the bladder was dissected off the pubocervical fascia anteriorly without complication.  The anterior cul-de-sac was then entered sharply without difficulty and a retractor was placed.  The same procedure was performed posteriorly and the posterior cul-de-sac was entered sharply without difficulty.  A long weighted speculum was inserted into the posterior cul-de-sac.  The Heaney clamp was then used to clamp the uterosacral ligaments on either side.  They were then cut and sutured ligated with 0 Vicryl, and the ligated uterosacral ligaments were transfixed to the ipsilateral vaginal epithelium to further support the vagina and provide hemostasis. Of note, all sutures used in this case were 0 Vicryl unless otherwise noted.   The cardinal ligaments were then clamped, cut and ligated. The uterine vessels and broad ligaments were then serially clamped with the Heaney clamps, cut, and suture ligated on both sides.  Excellent hemostasis was noted at this point.  The uterus was then delivered via the posterior cul-de-sac, and the cornua were clamped with the Heaney clamps, transected, and the uterus was delivered and sent to pathology. These pedicles were then suture ligated to ensure hemostasis.  After completion of the hysterectomy, all pedicles from the uterosacral ligament to the cornua were examined hemostasis was confirmed. The posterior vaginal cuff was then reefed with a running, locked suture. The vaginal cuff was then closed with a in a running locked fashion with care given to incorporate the uterosacral pedicles bilaterally.  All instruments were then removed from the pelvis and a vaginal packing saturated with estrogen cream was placed.  The patient tolerated the procedure well.  All  instruments, needles, and sponge counts were correct x 2. The patient was taken to the recovery room in stable condition.    Tereso Newcomer, MD 01/05/2013 11:26 AM

## 2013-01-05 NOTE — Anesthesia Postprocedure Evaluation (Signed)
  Anesthesia Post-op Note  Patient: Sarah Tyler  Procedure(s) Performed: Procedure(s): TOTAL VAGINAL HYSTERECTOMY  (N/A)  Patient Location: PACU and Women's Unit  Anesthesia Type:Spinal  Level of Consciousness: awake, alert  and oriented  Airway and Oxygen Therapy: Patient Spontanous Breathing  Post-op Pain: none  Post-op Assessment: Post-op Vital signs reviewed and Patient's Cardiovascular Status Stable  Post-op Vital Signs: Reviewed and stable  Complications: No apparent anesthesia complications

## 2013-01-06 ENCOUNTER — Encounter (HOSPITAL_COMMUNITY): Payer: Self-pay | Admitting: Obstetrics & Gynecology

## 2013-01-06 LAB — CBC
HCT: 33.7 % — ABNORMAL LOW (ref 36.0–46.0)
Hemoglobin: 11 g/dL — ABNORMAL LOW (ref 12.0–15.0)
MCH: 28.1 pg (ref 26.0–34.0)
MCHC: 32.6 g/dL (ref 30.0–36.0)
RDW: 13.4 % (ref 11.5–15.5)

## 2013-01-06 MED ORDER — OXYCODONE-ACETAMINOPHEN 5-325 MG PO TABS: 1.0000 | ORAL_TABLET | Freq: Four times a day (QID) | ORAL | Status: DC | PRN

## 2013-01-06 MED ORDER — DSS 100 MG PO CAPS: 100.0000 mg | ORAL_CAPSULE | Freq: Two times a day (BID) | ORAL | Status: DC

## 2013-01-06 MED ORDER — IBUPROFEN 800 MG PO TABS: 800.0000 mg | ORAL_TABLET | Freq: Three times a day (TID) | ORAL | Status: DC | PRN

## 2013-01-06 NOTE — Progress Notes (Signed)
UR completed 

## 2013-01-06 NOTE — Discharge Summary (Signed)
Gynecology Physician Discharge Summary  Patient ID: Sarah Tyler MRN: [id number] DOB/AGE: 1965/11/13 47 y.o.  Admit date: 01/05/2013 Discharge date: 01/06/2013  Preoperative Diagnoses:  Fibroids and menorrhagia  Procedures: TOTAL VAGINAL HYSTERECTOMY    Recent Labs: Lab 01/05/13 0805 01/06/13 0518  WBC 12.6* 11.0*  HGB 13.1 11.0*  HCT 39.2 33.7*  PLT 303 255     Hospital Course:  AMYJO MIZRACHI is a 47 y.o. [id number]  admitted for scheduled surgery.  She underwent the procedure as mentioned above, her operation was uncomplicated. For further details about surgery, please refer to the operative report. Patient had an uncomplicated postoperative course. By time of discharge, her pain was controlled on oral pain medications; she was ambulating, voiding without difficulty, tolerating regular diet and passing flatus. She was deemed stable for discharge to home.   Discharge Exam: Blood pressure 114/74, pulse 78, temperature 97.3 F (36.3 C), temperature source Oral, resp. rate 18, height 5\' 1"  (1.549 m), weight 167 lb (75.751 kg), last menstrual period 12/26/2012, SpO2 97.00%. General appearance: alert, cooperative and no distress Resp: clear to auscultation bilaterally Cardio: regular rate and rhythm GI: soft, non-tender; bowel sounds normal; no masses,  no organomegaly Pelvic:  Scant blood on pad Extremities: extremities normal, atraumatic, no cyanosis or edema and Homans sign is negative, no sign of DVT  Discharged Condition: Stable  Disposition: 01-Home or Self Care   Future Appointments Provider Department Dept Phone   02/05/2013 3:00 PM Tereso Newcomer, MD [facility] [phone]       Medication List         albuterol 108 (90 BASE) MCG/ACT inhaler  Commonly known as:  PROVENTIL HFA;VENTOLIN HFA  Inhale 2 puffs into the lungs every 6 (six) hours as needed for wheezing.     aspirin 81 MG tablet  Take 81 mg by mouth daily.     cetirizine 10 MG tablet   Commonly known as:  ZYRTEC  Take 10 mg by mouth daily as needed for allergies.     DSS 100 MG Caps  Take 100 mg by mouth 2 (two) times daily.     fish oil-omega-3 fatty acids 1000 MG capsule  Take 2 g by mouth daily.     ibuprofen 200 MG tablet  Commonly known as:  ADVIL,MOTRIN  Take 400 mg by mouth every 6 (six) hours as needed for pain.     ibuprofen 800 MG tablet  Commonly known as:  ADVIL,MOTRIN  Take 1 tablet (800 mg total) by mouth every 8 (eight) hours as needed (mild pain).     multivitamin with minerals tablet  Take 1 tablet by mouth daily.     oxyCODONE-acetaminophen 5-325 MG per tablet  Commonly known as:  PERCOCET/ROXICET  Take 1-2 tablets by mouth every 6 (six) hours as needed for pain.           Follow-up Information   Follow up with Tereso Newcomer, MD On 02/05/2013. (3 pm for postoperative appointment. Please call clinic or go to [facility] for any postoperative concerns or as needed)    Specialty:  Obstetrics and Gynecology   Contact information:   [address] [place] [address] [phone]       Signed:  Jaynie Collins, MD, FACOG Attending Obstetrician & Gynecologist Faculty Practice, [facility]

## 2013-01-06 NOTE — Plan of Care (Signed)
Problem: Phase II Progression Outcomes Goal: Foley discontinued Outcome: Completed/Met Date Met:  01/06/13 0600am

## 2013-01-06 NOTE — Progress Notes (Signed)
Pt discharged to home with daughter.  Condition stable.  Pt ambulated to car with Clinton Gallant, RN.  No equipment for home ordered at discharge.

## 2013-01-09 ENCOUNTER — Telehealth: Payer: Self-pay | Admitting: General Practice

## 2013-01-09 NOTE — Telephone Encounter (Signed)
Message copied by Kathee Delton on Fri Jan 09, 2013 11:01 AM ------      Message from: Jaynie Collins A      Created: Wed Jan 07, 2013  8:31 PM       Benign pathology results. Please call to inform patient of results.       ------

## 2013-01-09 NOTE — Telephone Encounter (Signed)
Called patient and informed her of normal pathology results. Patient verbalized understanding and had no further questions

## 2013-01-30 ENCOUNTER — Encounter: Payer: Self-pay | Admitting: *Deleted

## 2013-02-05 ENCOUNTER — Encounter: Payer: Self-pay | Admitting: Obstetrics & Gynecology

## 2013-02-05 ENCOUNTER — Ambulatory Visit (INDEPENDENT_AMBULATORY_CARE_PROVIDER_SITE_OTHER): Payer: Self-pay | Admitting: Obstetrics & Gynecology

## 2013-02-05 VITALS — BP 110/67 | HR 87 | Temp 97.5°F | Ht 60.0 in | Wt 166.1 lb

## 2013-02-05 DIAGNOSIS — Z09 Encounter for follow-up examination after completed treatment for conditions other than malignant neoplasm: Secondary | ICD-10-CM

## 2013-02-05 DIAGNOSIS — Z90710 Acquired absence of both cervix and uterus: Secondary | ICD-10-CM

## 2013-02-05 NOTE — Patient Instructions (Addendum)
  Thank you for enrolling in MyChart. Please follow the instructions below to securely access your online medical record. MyChart allows you to send messages to your doctor, view your test results, manage appointments, and more.   How Do I Sign Up? 1. In your Internet browser, go to [employer] and enter [email or web address]. 2. Click on the Sign Up Now link in the Sign In box. You will see the New Member Sign Up page. 3. Enter your MyChart Access Code exactly as it appears below. You will not need to use this code after you've completed the sign-up process. If you do not sign up before the expiration date, you must request a new code.  MyChart Access Code: [email or web address] Expires: 04/06/2013  2:50 PM  4. Enter your Social Security Number ([email or web address]) and Date of Birth (mm/dd/yyyy) as indicated and click Submit. You will be taken to the next sign-up page. 5. Create a MyChart ID. This will be your MyChart login ID and cannot be changed, so think of one that is secure and easy to remember. 6. Create a MyChart password. You can change your password at any time. 7. Enter your Password Reset Question and Answer. This can be used at a later time if you forget your password.  8. Enter your e-mail address. You will receive e-mail notification when new information is available in MyChart. 9. Click Sign Up. You can now view your medical record.   Additional Information Remember, MyChart is NOT to be used for urgent needs. For medical emergencies, dial 911.

## 2013-02-05 NOTE — Progress Notes (Signed)
  Subjective:     Sarah Tyler is a 47 y.o. [phone] female who presents to the clinic status post total vaginal hysterectomy on 10/201/14 for abnormal uterine bleeding and fibroids.  She is eating a regular diet without difficulty. Bowel movements are normal. The patient is not having any pain.  The following portions of the patient's history were reviewed and updated as appropriate: allergies, current medications, past family history, past medical history, past social history, past surgical history and problem list.  Normal mammogram on 05/21/12.  Review of Systems Pertinent items are noted in HPI.    Objective:    BP 110/67  Pulse 87  Temp(Src) 97.5 F (36.4 C) (Oral)  Ht 5' (1.524 m)  Wt 166 lb 1.6 oz (75.342 kg)  BMI 32.44 kg/m2  LMP 12/19/2012 General:  alert and no distress  Abdomen: soft, bowel sounds active, non-tender  Pelvic:   Well-healed vaginal cuff, some sutures are still visible; small amount of yellow-brown vaginal thin discharge.    01/05/13 Pathology Uterus and cervix - LEIOMYOMATA. - ADENOMYOSIS. - ENDOMETRIUM: BENIGN PROLIFERATIVE ENDOMETRIUM WITH NO ATYPIA, HYPERPLASIA OR MALIGNANCY. - CERVIX: BENIGN CERVICAL SQUAMOUS MUCOSA AND ENDOCERVICAL MUCOSA, NO DYSPLASTIC OR MALIGNANCY. - UTERINE SEROSA: UNREMARKABLE.  Assessment:    Doing well postoperatively.  Operative findings again reviewed. Pathology report discussed.    Plan:   1. Continue any current medications. 2. Activity restrictions: no lifting more than 15 pounds and pelvic rest for 8 weeks after surgery 3. Anticipated return to work: not applicable. 4. Follow up:  One year or as needed.  Jaynie Collins, MD, FACOG Attending Obstetrician & Gynecologist Faculty Practice, [facility] [place]

## 2013-03-30 ENCOUNTER — Ambulatory Visit (INDEPENDENT_AMBULATORY_CARE_PROVIDER_SITE_OTHER): Payer: Self-pay | Admitting: Internal Medicine

## 2013-03-30 ENCOUNTER — Encounter: Payer: Self-pay | Admitting: Internal Medicine

## 2013-03-30 VITALS — BP 100/78 | HR 68 | Temp 97.4°F | Resp 16 | Wt 165.0 lb

## 2013-03-30 DIAGNOSIS — R209 Unspecified disturbances of skin sensation: Secondary | ICD-10-CM

## 2013-03-30 DIAGNOSIS — M542 Cervicalgia: Secondary | ICD-10-CM

## 2013-03-30 DIAGNOSIS — R202 Paresthesia of skin: Secondary | ICD-10-CM | POA: Insufficient documentation

## 2013-03-30 MED ORDER — MELOXICAM 15 MG PO TABS: 15.0000 mg | ORAL_TABLET | Freq: Every day | ORAL | Status: DC | PRN

## 2013-03-30 NOTE — Progress Notes (Signed)
   Subjective:    Patient ID: Sarah Tyler, female    DOB: Aug 17, 1965, 48 y.o.   MRN: [id number]  Neck Pain  This is a new problem. The current episode started more than 1 month ago. The problem occurs 2 to 4 times per day. The problem has been gradually worsening. The pain is present in the occipital region. The pain is moderate. The symptoms are aggravated by position. Associated symptoms include numbness (B hand R>L). Pertinent negatives include no headaches or weakness. She has tried nothing for the symptoms.      Review of Systems  Constitutional: Negative for chills, activity change, appetite change, fatigue and unexpected weight change.  HENT: Negative for congestion, mouth sores and sinus pressure.   Eyes: Negative for visual disturbance.  Respiratory: Negative for cough and chest tightness.   Gastrointestinal: Negative for nausea and abdominal pain.  Genitourinary: Negative for frequency, difficulty urinating and vaginal pain.  Musculoskeletal: Positive for neck pain. Negative for back pain and gait problem.  Skin: Negative for pallor and rash.  Neurological: Positive for numbness (B hand R>L). Negative for dizziness, tremors, weakness and headaches.  Psychiatric/Behavioral: Negative for confusion and sleep disturbance.       Objective:   Physical Exam  Constitutional: She appears well-developed. No distress.  HENT:  Head: Normocephalic.  Right Ear: External ear normal.  Left Ear: External ear normal.  Nose: Nose normal.  Mouth/Throat: Oropharynx is clear and moist.  Eyes: Conjunctivae are normal. Pupils are equal, round, and reactive to light. Right eye exhibits no discharge. Left eye exhibits no discharge.  Neck: Normal range of motion. Neck supple. No JVD present. No tracheal deviation present. No thyromegaly present.  Cardiovascular: Normal rate, regular rhythm and normal heart sounds.   Pulmonary/Chest: No stridor. No respiratory distress. She has no wheezes.    Abdominal: Soft. Bowel sounds are normal. She exhibits no distension and no mass. There is no tenderness. There is no rebound and no guarding.  Musculoskeletal: She exhibits tenderness (neck is tender over paraspinal muscles). She exhibits no edema.  R>L  Lymphadenopathy:    She has no cervical adenopathy.  Neurological: She displays normal reflexes. No cranial nerve deficit. She exhibits normal muscle tone. Coordination normal.  Skin: No rash noted. No erythema.  Psychiatric: She has a normal mood and affect. Her behavior is normal. Judgment and thought content normal.     Lab Results  Component Value Date   WBC 13.6* 03/31/2013   HGB 12.5 03/31/2013   HCT 37.2 03/31/2013   PLT 315.0 03/31/2013   GLUCOSE 86 03/31/2013   CHOL 135 10/14/2012   TRIG 150.0* 10/14/2012   HDL 30.60* 10/14/2012   LDLCALC 74 10/14/2012   ALT 14 03/31/2013   AST 14 03/31/2013   NA 139 03/31/2013   K 3.9 03/31/2013   CL 106 03/31/2013   CREATININE 0.7 03/31/2013   BUN 11 03/31/2013   CO2 27 03/31/2013   TSH 1.10 03/31/2013   HGBA1C 5.6 10/14/2012        Assessment & Plan:

## 2013-03-30 NOTE — Assessment & Plan Note (Signed)
Labs

## 2013-03-30 NOTE — Progress Notes (Signed)
Pre visit review using our clinic review tool, if applicable. No additional management support is needed unless otherwise documented below in the visit note. 

## 2013-03-30 NOTE — Assessment & Plan Note (Signed)
1/15 R>L occipital C spine Xray NSAIDs trial Labs

## 2013-03-31 ENCOUNTER — Other Ambulatory Visit (INDEPENDENT_AMBULATORY_CARE_PROVIDER_SITE_OTHER): Payer: Self-pay

## 2013-03-31 ENCOUNTER — Ambulatory Visit (INDEPENDENT_AMBULATORY_CARE_PROVIDER_SITE_OTHER)
Admission: RE | Admit: 2013-03-31 | Discharge: 2013-03-31 | Disposition: A | Discharge status: Home / Self Care | Payer: Self-pay | Source: Ambulatory Visit | Attending: Internal Medicine | Admitting: Internal Medicine

## 2013-03-31 DIAGNOSIS — R209 Unspecified disturbances of skin sensation: Secondary | ICD-10-CM

## 2013-03-31 DIAGNOSIS — M542 Cervicalgia: Secondary | ICD-10-CM

## 2013-03-31 DIAGNOSIS — R202 Paresthesia of skin: Secondary | ICD-10-CM

## 2013-03-31 LAB — TSH: TSH: 1.1 u[IU]/mL (ref 0.35–5.50)

## 2013-03-31 LAB — BASIC METABOLIC PANEL
BUN: 11 mg/dL (ref 6–23)
CO2: 27 mEq/L (ref 19–32)
Calcium: 9.2 mg/dL (ref 8.4–10.5)
Chloride: 106 mEq/L (ref 96–112)
Creatinine, Ser: 0.7 mg/dL (ref 0.4–1.2)
GFR: 106.83 mL/min (ref >60.00)
GLUCOSE: 86 mg/dL (ref 70–99)
POTASSIUM: 3.9 meq/L (ref 3.5–5.1)
SODIUM: 139 meq/L (ref 135–145)

## 2013-03-31 LAB — CBC WITH DIFFERENTIAL/PLATELET
BASOS PCT: 0.2 % (ref 0.0–3.0)
Basophils Absolute: 0 10*3/uL (ref 0.0–0.1)
EOS PCT: 4.4 % (ref 0.0–5.0)
Eosinophils Absolute: 0.6 10*3/uL (ref 0.0–0.7)
HEMATOCRIT: 37.2 % (ref 36.0–46.0)
Hemoglobin: 12.5 g/dL (ref 12.0–15.0)
LYMPHS ABS: 4.5 10*3/uL — AB (ref 0.7–4.0)
Lymphocytes Relative: 33.1 % (ref 12.0–46.0)
MCHC: 33.5 g/dL (ref 30.0–36.0)
MCV: 81.8 fl (ref 78.0–100.0)
Monocytes Absolute: 0.7 10*3/uL (ref 0.1–1.0)
Monocytes Relative: 4.8 % (ref 3.0–12.0)
Neutro Abs: 7.8 10*3/uL — ABNORMAL HIGH (ref 1.4–7.7)
Neutrophils Relative %: 57.5 % (ref 43.0–77.0)
Platelets: 315 10*3/uL (ref 150.0–400.0)
RBC: 4.55 Mil/uL (ref 3.87–5.11)
RDW: 13 % (ref 11.5–14.6)
WBC: 13.6 10*3/uL — ABNORMAL HIGH (ref 4.5–10.5)

## 2013-03-31 LAB — SEDIMENTATION RATE: SED RATE: 46 mm/h — AB (ref 0–22)

## 2013-03-31 LAB — HEPATIC FUNCTION PANEL
ALBUMIN: 3.3 g/dL — AB (ref 3.5–5.2)
ALT: 14 U/L (ref 0–35)
AST: 14 U/L (ref 0–37)
Alkaline Phosphatase: 72 U/L (ref 39–117)
Bilirubin, Direct: 0 mg/dL (ref 0.0–0.3)
Total Bilirubin: 0.4 mg/dL (ref 0.3–1.2)
Total Protein: 7.3 g/dL (ref 6.0–8.3)

## 2013-03-31 LAB — VITAMIN B12: VITAMIN B 12: 425 pg/mL (ref 211–911)

## 2013-04-04 ENCOUNTER — Encounter: Payer: Self-pay | Admitting: Internal Medicine

## 2013-04-07 ENCOUNTER — Telehealth: Payer: Self-pay | Admitting: Internal Medicine

## 2013-04-07 NOTE — Telephone Encounter (Signed)
Pt got a call to make an appt for lab results.  She wants to know if she can switch from [place] to Dr. Alain Marion.

## 2013-04-08 NOTE — Telephone Encounter (Signed)
Ok Thx 

## 2013-04-08 NOTE — Telephone Encounter (Signed)
Ok with me 

## 2013-04-16 ENCOUNTER — Ambulatory Visit (INDEPENDENT_AMBULATORY_CARE_PROVIDER_SITE_OTHER): Payer: Self-pay | Admitting: Internal Medicine

## 2013-04-16 ENCOUNTER — Encounter: Payer: Self-pay | Admitting: Internal Medicine

## 2013-04-16 VITALS — BP 130/88 | HR 72 | Temp 98.1°F | Resp 16 | Wt 166.0 lb

## 2013-04-16 DIAGNOSIS — Z720 Tobacco use: Secondary | ICD-10-CM

## 2013-04-16 DIAGNOSIS — F17200 Nicotine dependence, unspecified, uncomplicated: Secondary | ICD-10-CM

## 2013-04-16 DIAGNOSIS — M542 Cervicalgia: Secondary | ICD-10-CM

## 2013-04-16 DIAGNOSIS — R202 Paresthesia of skin: Secondary | ICD-10-CM

## 2013-04-16 DIAGNOSIS — R209 Unspecified disturbances of skin sensation: Secondary | ICD-10-CM

## 2013-04-16 MED ORDER — IBUPROFEN 600 MG PO TABS: ORAL_TABLET | ORAL | Status: DC

## 2013-04-16 NOTE — Progress Notes (Signed)
Pre visit review using our clinic review tool, if applicable. No additional management support is needed unless otherwise documented below in the visit note. 

## 2013-04-16 NOTE — Assessment & Plan Note (Addendum)
1/15 R>L radiculopathic  04/03/13 x ray IMPRESSION:  1. There is mild stable narrowing of the C6-7 disc space. There is  mild reversal of the normal cervical lordosis which may reflect  muscle spasm. There is no evidence of a compression fracture.  2. Given the patient's symptoms, cervical spine MRI may be useful if  the patient can tolerate the procedure. Ibuprofen helped better R>L  May need a CXR

## 2013-04-16 NOTE — Assessment & Plan Note (Signed)
Needs to d/c

## 2013-04-16 NOTE — Assessment & Plan Note (Signed)
1/15 R>L ?cerv radiculpathy MRI C-spine

## 2013-04-16 NOTE — Progress Notes (Signed)
   Subjective:    Patient ID: Sarah Tyler, female    DOB: 1965/07/11, 48 y.o.   MRN: [id number]  Neck Pain  This is a recurrent problem. The current episode started more than 1 month ago. The problem occurs 2 to 4 times per day. The problem has been unchanged. The pain is present in the occipital region. The pain is moderate. The symptoms are aggravated by position. Associated symptoms include numbness (B hand R>L). Pertinent negatives include no headaches or weakness. She has tried nothing for the symptoms.  R>L    Review of Systems  Constitutional: Negative for chills, activity change, appetite change, fatigue and unexpected weight change.  HENT: Negative for congestion, mouth sores and sinus pressure.   Eyes: Negative for visual disturbance.  Respiratory: Negative for cough and chest tightness.   Gastrointestinal: Negative for nausea and abdominal pain.  Genitourinary: Negative for frequency, difficulty urinating and vaginal pain.  Musculoskeletal: Positive for neck pain. Negative for back pain and gait problem.  Skin: Negative for pallor and rash.  Neurological: Positive for numbness (B hand R>L). Negative for dizziness, tremors, weakness and headaches.  Psychiatric/Behavioral: Negative for confusion and sleep disturbance.       Objective:   Physical Exam  Constitutional: She appears well-developed. No distress.  HENT:  Head: Normocephalic.  Right Ear: External ear normal.  Left Ear: External ear normal.  Nose: Nose normal.  Mouth/Throat: Oropharynx is clear and moist.  Eyes: Conjunctivae are normal. Pupils are equal, round, and reactive to light. Right eye exhibits no discharge. Left eye exhibits no discharge.  Neck: Normal range of motion. Neck supple. No JVD present. No tracheal deviation present. No thyromegaly present.  Cardiovascular: Normal rate, regular rhythm and normal heart sounds.   Pulmonary/Chest: No stridor. No respiratory distress. She has no wheezes.   Abdominal: Soft. Bowel sounds are normal. She exhibits no distension and no mass. There is no tenderness. There is no rebound and no guarding.  Musculoskeletal: She exhibits tenderness (neck is tender over paraspinal muscles). She exhibits no edema.  R>L  Lymphadenopathy:    She has no cervical adenopathy.  Neurological: She displays normal reflexes. No cranial nerve deficit. She exhibits normal muscle tone. Coordination normal.  Skin: No rash noted. No erythema.  Psychiatric: She has a normal mood and affect. Her behavior is normal. Judgment and thought content normal.     Lab Results  Component Value Date   WBC 13.6* 03/31/2013   HGB 12.5 03/31/2013   HCT 37.2 03/31/2013   PLT 315.0 03/31/2013   GLUCOSE 86 03/31/2013   CHOL 135 10/14/2012   TRIG 150.0* 10/14/2012   HDL 30.60* 10/14/2012   LDLCALC 74 10/14/2012   ALT 14 03/31/2013   AST 14 03/31/2013   NA 139 03/31/2013   K 3.9 03/31/2013   CL 106 03/31/2013   CREATININE 0.7 03/31/2013   BUN 11 03/31/2013   CO2 27 03/31/2013   TSH 1.10 03/31/2013   HGBA1C 5.6 10/14/2012   04/03/13 x ray IMPRESSION:  1. There is mild stable narrowing of the C6-7 disc space. There is  mild reversal of the normal cervical lordosis which may reflect  muscle spasm. There is no evidence of a compression fracture.  2. Given the patient's symptoms, cervical spine MRI may be useful if  the patient can tolerate the procedure.     Assessment & Plan:

## 2013-04-17 ENCOUNTER — Telehealth: Payer: Self-pay | Admitting: Internal Medicine

## 2013-04-17 NOTE — Telephone Encounter (Signed)
Relevant patient education assigned to patient using Emmi. ° °

## 2013-04-27 ENCOUNTER — Ambulatory Visit
Admission: RE | Admit: 2013-04-27 | Discharge: 2013-04-27 | Disposition: A | Discharge status: Home / Self Care | Payer: No Typology Code available for payment source | Source: Ambulatory Visit | Attending: Internal Medicine | Admitting: Internal Medicine

## 2013-04-27 MED ORDER — GADOBENATE DIMEGLUMINE 529 MG/ML IV SOLN
15.0000 mL | Freq: Once | INTRAVENOUS | Status: AC | PRN
  Administered 2013-04-27: 15 mL via INTRAVENOUS

## 2013-04-30 ENCOUNTER — Other Ambulatory Visit: Payer: Self-pay | Admitting: Obstetrics & Gynecology

## 2013-04-30 DIAGNOSIS — Z1231 Encounter for screening mammogram for malignant neoplasm of breast: Secondary | ICD-10-CM

## 2013-05-14 ENCOUNTER — Ambulatory Visit: Payer: No Typology Code available for payment source | Admitting: Physical Therapy

## 2013-05-25 ENCOUNTER — Ambulatory Visit (HOSPITAL_COMMUNITY)
Admission: RE | Admit: 2013-05-25 | Discharge: 2013-05-25 | Disposition: A | Discharge status: Home / Self Care | Payer: Self-pay | Source: Ambulatory Visit | Attending: Obstetrics & Gynecology | Admitting: Obstetrics & Gynecology

## 2013-05-25 ENCOUNTER — Other Ambulatory Visit: Payer: Self-pay | Admitting: Obstetrics & Gynecology

## 2013-05-25 DIAGNOSIS — Z1231 Encounter for screening mammogram for malignant neoplasm of breast: Secondary | ICD-10-CM

## 2013-05-28 ENCOUNTER — Ambulatory Visit: Payer: No Typology Code available for payment source | Attending: Internal Medicine | Admitting: Physical Therapy

## 2013-11-23 ENCOUNTER — Encounter (HOSPITAL_COMMUNITY): Payer: Self-pay | Admitting: Emergency Medicine

## 2013-11-23 ENCOUNTER — Emergency Department (HOSPITAL_COMMUNITY)
Admission: EM | Admit: 2013-11-23 | Discharge: 2013-11-23 | Disposition: A | Discharge status: Home / Self Care | Payer: No Typology Code available for payment source | Attending: Emergency Medicine | Admitting: Emergency Medicine

## 2013-11-23 DIAGNOSIS — Z79899 Other long term (current) drug therapy: Secondary | ICD-10-CM | POA: Insufficient documentation

## 2013-11-23 DIAGNOSIS — Y939 Activity, unspecified: Secondary | ICD-10-CM | POA: Insufficient documentation

## 2013-11-23 DIAGNOSIS — S60469A Insect bite (nonvenomous) of unspecified finger, initial encounter: Secondary | ICD-10-CM | POA: Insufficient documentation

## 2013-11-23 DIAGNOSIS — F17200 Nicotine dependence, unspecified, uncomplicated: Secondary | ICD-10-CM | POA: Insufficient documentation

## 2013-11-23 DIAGNOSIS — W57XXXA Bitten or stung by nonvenomous insect and other nonvenomous arthropods, initial encounter: Secondary | ICD-10-CM | POA: Insufficient documentation

## 2013-11-23 DIAGNOSIS — Y929 Unspecified place or not applicable: Secondary | ICD-10-CM | POA: Insufficient documentation

## 2013-11-23 DIAGNOSIS — Z8742 Personal history of other diseases of the female genital tract: Secondary | ICD-10-CM | POA: Insufficient documentation

## 2013-11-23 DIAGNOSIS — Z7982 Long term (current) use of aspirin: Secondary | ICD-10-CM | POA: Insufficient documentation

## 2013-11-23 MED ORDER — FAMOTIDINE 20 MG PO TABS: 20.0000 mg | ORAL_TABLET | Freq: Two times a day (BID) | ORAL | Status: DC

## 2013-11-23 MED ORDER — IBUPROFEN 200 MG PO TABS
600.0000 mg | ORAL_TABLET | Freq: Once | ORAL | Status: AC
  Administered 2013-11-23: 600 mg via ORAL
  Filled 2013-11-23: qty 3

## 2013-11-23 MED ORDER — IBUPROFEN 600 MG PO TABS: 600.0000 mg | ORAL_TABLET | Freq: Three times a day (TID) | ORAL | Status: DC | PRN

## 2013-11-23 MED ORDER — CEPHALEXIN 250 MG PO CAPS
250.0000 mg | ORAL_CAPSULE | Freq: Once | ORAL | Status: AC
  Administered 2013-11-23: 250 mg via ORAL
  Filled 2013-11-23: qty 1

## 2013-11-23 MED ORDER — CEPHALEXIN 250 MG PO CAPS: 250.0000 mg | ORAL_CAPSULE | Freq: Four times a day (QID) | ORAL | Status: DC

## 2013-11-23 MED ORDER — FAMOTIDINE 20 MG PO TABS
20.0000 mg | ORAL_TABLET | Freq: Once | ORAL | Status: AC
  Administered 2013-11-23: 20 mg via ORAL
  Filled 2013-11-23: qty 1

## 2013-11-23 NOTE — ED Notes (Signed)
Knows to take abx regimen until complete. No other questions/concerns.

## 2013-11-23 NOTE — ED Provider Notes (Signed)
Medical screening examination/treatment/procedure(s) were performed by non-physician practitioner and as supervising physician I was immediately available for consultation/collaboration.   EKG Interpretation None        Debby Freiberg, MD 11/23/13 [phone]

## 2013-11-23 NOTE — ED Provider Notes (Signed)
CSN: [id number]     Arrival date & time 11/23/13  1958 History   None    Chief Complaint  Patient presents with  . Insect Bite   The history is provided by the patient. No language interpreter was used.  This chart was scribed for nurse practitioner Junius Creamer, NP working with No att. providers found, by Thea Alken, ED Scribe. This patient was seen in room WTR5/WTR5 and the patient's care was started at 10:21 PM.  Sarah Tyler is a 48 y.o. female who presents to the Emergency Department complaining of an insect bite to left hand onset 1 day ago between 7pm-9pm. Pt reports after she bitten, area became itchy. She reports she woke up this morning and area was swollen. She denies treating area.   Past Medical History  Diagnosis Date  . Allergy   . Irregular bleeding   . Fibroids    Past Surgical History  Procedure Laterality Date  . Tubal ligation    . Vaginal hysterectomy N/A 01/05/2013    Procedure: TOTAL VAGINAL HYSTERECTOMY ;  Surgeon: Osborne Oman, MD;  Location: [facility];  Service: Gynecology;  Laterality: N/A;   Family History  Problem Relation Age of Onset  . Cancer Mother     cervical  . Hypertension Mother    History  Substance Use Topics  . Smoking status: Current Every Day Smoker -- 0.25 packs/day    Types: Cigarettes  . Smokeless tobacco: Never Used  . Alcohol Use: No   OB History   Grav Para Term Preterm Abortions TAB SAB Ect Mult Living   4 2 2  2 1 1   2      Review of Systems  Constitutional: Negative for fever and chills.  Musculoskeletal: Negative for joint swelling.  Skin: Positive for color change and wound. Negative for rash.  Neurological: Negative for numbness.    Allergies  Review of patient's allergies indicates no known allergies.  Home Medications   Prior to Admission medications   Medication Sig Start Date End Date Taking? Authorizing Provider  albuterol (PROVENTIL HFA;VENTOLIN HFA) 108 (90 BASE) MCG/ACT inhaler Inhale 2 puffs into  the lungs every 6 (six) hours as needed for wheezing. 10/14/12  Yes Jearld Fenton, NP  aspirin 81 MG tablet Take 81 mg by mouth daily.     Yes Historical Provider, MD  cetirizine (ZYRTEC) 10 MG tablet Take 10 mg by mouth daily as needed for allergies.  10/14/12  Yes Jearld Fenton, NP  fish oil-omega-3 fatty acids 1000 MG capsule Take 2 g by mouth daily.    Yes Historical Provider, MD  Multiple Vitamins-Minerals (MULTIVITAMIN WITH MINERALS) tablet Take 1 tablet by mouth daily.   Yes Historical Provider, MD  cephALEXin (KEFLEX) 250 MG capsule Take 1 capsule (250 mg total) by mouth 4 (four) times daily. 11/23/13   Garald Balding, NP  famotidine (PEPCID) 20 MG tablet Take 1 tablet (20 mg total) by mouth 2 (two) times daily. 11/23/13   Garald Balding, NP  ibuprofen (ADVIL,MOTRIN) 600 MG tablet Take 1 tablet (600 mg total) by mouth every 8 (eight) hours as needed. 11/23/13   Garald Balding, NP   BP 123/88  Pulse 90  Temp(Src) 98.1 F (36.7 C) (Oral)  Resp 16  SpO2 99%  LMP 12/19/2012 Physical Exam  Nursing note and vitals reviewed. Constitutional: She is oriented to person, place, and time. She appears well-developed and well-nourished. No distress.  HENT:  Head: Normocephalic and  atraumatic.  Eyes: EOM are normal. Pupils are equal, round, and reactive to light.  Neck: Neck supple.  Cardiovascular: Normal rate.   Pulmonary/Chest: Effort normal.  Musculoskeletal: Normal range of motion. She exhibits edema. She exhibits no tenderness.  Neurological: She is alert and oriented to person, place, and time.  Skin: Skin is warm and dry. There is erythema.  Bite above R PIP joint on R thumb with surrounding erythema small nodule without fluctuance   Psychiatric: She has a normal mood and affect. Her behavior is normal.    ED Course  Procedures  DIAGNOSTIC STUDIES: Oxygen Saturation is 99% on RA, normal by my interpretation.    COORDINATION OF CARE: 10:21 PM- Pt advised of plan for treatment and pt  agrees.  Labs Review Labs Reviewed - No data to display  Imaging Review No results found.   EKG Interpretation None      MDM   Final diagnoses:  Insect bite    I personally performed the services described in this documentation, which was scribed in my presence. The recorded information has been reviewed and is accurate.      Garald Balding, NP 11/23/13 2221

## 2013-11-23 NOTE — ED Notes (Signed)
Pt reports insect bite to R hand.  Swelling and warm to touch.

## 2013-11-23 NOTE — Discharge Instructions (Signed)
You have been placed on an antibiotic as a precaution  Please see your PCP or return if your develop new or worsening symptoms

## 2014-01-18 ENCOUNTER — Encounter (HOSPITAL_COMMUNITY): Payer: Self-pay | Admitting: Emergency Medicine

## 2014-05-08 ENCOUNTER — Emergency Department (HOSPITAL_COMMUNITY)
Admission: EM | Admit: 2014-05-08 | Discharge: 2014-05-08 | Disposition: A | Discharge status: Home / Self Care | Payer: No Typology Code available for payment source | Attending: Emergency Medicine | Admitting: Emergency Medicine

## 2014-05-08 ENCOUNTER — Encounter (HOSPITAL_COMMUNITY): Payer: Self-pay

## 2014-05-08 DIAGNOSIS — Y9389 Activity, other specified: Secondary | ICD-10-CM | POA: Insufficient documentation

## 2014-05-08 DIAGNOSIS — S46912A Strain of unspecified muscle, fascia and tendon at shoulder and upper arm level, left arm, initial encounter: Secondary | ICD-10-CM | POA: Insufficient documentation

## 2014-05-08 DIAGNOSIS — Z8742 Personal history of other diseases of the female genital tract: Secondary | ICD-10-CM | POA: Insufficient documentation

## 2014-05-08 DIAGNOSIS — Z792 Long term (current) use of antibiotics: Secondary | ICD-10-CM | POA: Insufficient documentation

## 2014-05-08 DIAGNOSIS — S46812A Strain of other muscles, fascia and tendons at shoulder and upper arm level, left arm, initial encounter: Secondary | ICD-10-CM

## 2014-05-08 DIAGNOSIS — Z79899 Other long term (current) drug therapy: Secondary | ICD-10-CM | POA: Insufficient documentation

## 2014-05-08 DIAGNOSIS — Z720 Tobacco use: Secondary | ICD-10-CM | POA: Insufficient documentation

## 2014-05-08 DIAGNOSIS — Y9289 Other specified places as the place of occurrence of the external cause: Secondary | ICD-10-CM | POA: Insufficient documentation

## 2014-05-08 DIAGNOSIS — Y998 Other external cause status: Secondary | ICD-10-CM | POA: Insufficient documentation

## 2014-05-08 MED ORDER — NAPROXEN 500 MG PO TABS
500.0000 mg | ORAL_TABLET | Freq: Once | ORAL | Status: AC
  Administered 2014-05-08: 500 mg via ORAL
  Filled 2014-05-08: qty 1

## 2014-05-08 MED ORDER — NAPROXEN 500 MG PO TABS: 500.0000 mg | ORAL_TABLET | Freq: Two times a day (BID) | ORAL | Status: DC

## 2014-05-08 MED ORDER — METHOCARBAMOL 500 MG PO TABS: 500.0000 mg | ORAL_TABLET | Freq: Two times a day (BID) | ORAL | Status: DC
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
## 2014-05-25 ENCOUNTER — Other Ambulatory Visit: Payer: Self-pay | Admitting: Obstetrics & Gynecology

## 2014-05-25 DIAGNOSIS — Z1231 Encounter for screening mammogram for malignant neoplasm of breast: Secondary | ICD-10-CM

## 2014-05-27 ENCOUNTER — Ambulatory Visit (HOSPITAL_COMMUNITY)
Admission: RE | Admit: 2014-05-27 | Discharge: 2014-05-27 | Disposition: A | Discharge status: Home / Self Care | Payer: No Typology Code available for payment source | Source: Ambulatory Visit | Attending: Obstetrics & Gynecology | Admitting: Obstetrics & Gynecology

## 2014-05-27 DIAGNOSIS — Z1231 Encounter for screening mammogram for malignant neoplasm of breast: Secondary | ICD-10-CM

## 2015-05-04 ENCOUNTER — Other Ambulatory Visit: Payer: Self-pay | Admitting: Obstetrics & Gynecology

## 2015-05-04 DIAGNOSIS — Z1231 Encounter for screening mammogram for malignant neoplasm of breast: Secondary | ICD-10-CM

## 2015-05-30 ENCOUNTER — Ambulatory Visit
Admission: RE | Admit: 2015-05-30 | Discharge: 2015-05-30 | Disposition: A | Discharge status: Home / Self Care | Payer: No Typology Code available for payment source | Source: Ambulatory Visit | Attending: Obstetrics & Gynecology | Admitting: Obstetrics & Gynecology

## 2015-05-30 DIAGNOSIS — Z1231 Encounter for screening mammogram for malignant neoplasm of breast: Secondary | ICD-10-CM

## 2015-06-17 LAB — GLUCOSE, POCT (MANUAL RESULT ENTRY): POC GLUCOSE: 92 mg/dL (ref 70–99)

## 2015-09-06 ENCOUNTER — Ambulatory Visit (INDEPENDENT_AMBULATORY_CARE_PROVIDER_SITE_OTHER): Payer: BLUE CROSS/BLUE SHIELD | Admitting: Internal Medicine

## 2015-09-06 ENCOUNTER — Encounter: Payer: Self-pay | Admitting: Internal Medicine

## 2015-09-06 ENCOUNTER — Ambulatory Visit (INDEPENDENT_AMBULATORY_CARE_PROVIDER_SITE_OTHER)
Admission: RE | Admit: 2015-09-06 | Discharge: 2015-09-06 | Disposition: A | Discharge status: Home / Self Care | Payer: BLUE CROSS/BLUE SHIELD | Source: Ambulatory Visit | Attending: Internal Medicine | Admitting: Internal Medicine

## 2015-09-06 VITALS — BP 122/80 | HR 84 | Ht 60.05 in | Wt 170.0 lb

## 2015-09-06 DIAGNOSIS — Z23 Encounter for immunization: Secondary | ICD-10-CM

## 2015-09-06 DIAGNOSIS — J309 Allergic rhinitis, unspecified: Secondary | ICD-10-CM | POA: Diagnosis not present

## 2015-09-06 DIAGNOSIS — Z111 Encounter for screening for respiratory tuberculosis: Secondary | ICD-10-CM

## 2015-09-06 DIAGNOSIS — Z720 Tobacco use: Secondary | ICD-10-CM

## 2015-09-06 DIAGNOSIS — J4520 Mild intermittent asthma, uncomplicated: Secondary | ICD-10-CM | POA: Diagnosis not present

## 2015-09-06 DIAGNOSIS — Z0000 Encounter for general adult medical examination without abnormal findings: Secondary | ICD-10-CM

## 2015-09-06 DIAGNOSIS — Z90710 Acquired absence of both cervix and uterus: Secondary | ICD-10-CM

## 2015-09-06 DIAGNOSIS — J45909 Unspecified asthma, uncomplicated: Secondary | ICD-10-CM | POA: Insufficient documentation

## 2015-09-06 DIAGNOSIS — Z0001 Encounter for general adult medical examination with abnormal findings: Secondary | ICD-10-CM | POA: Insufficient documentation

## 2015-09-06 MED ORDER — METHOCARBAMOL 500 MG PO TABS: 500.0000 mg | ORAL_TABLET | Freq: Two times a day (BID) | ORAL | Status: DC

## 2015-09-06 MED ORDER — VITAMIN D 1000 UNITS PO TABS: 1000.0000 [IU] | ORAL_TABLET | Freq: Every day | ORAL | Status: AC

## 2015-09-06 MED ORDER — MONTELUKAST SODIUM 10 MG PO TABS: 10.0000 mg | ORAL_TABLET | Freq: Every day | ORAL | Status: DC

## 2015-09-06 MED ORDER — ALBUTEROL SULFATE HFA 108 (90 BASE) MCG/ACT IN AERS: 2.0000 | INHALATION_SPRAY | RESPIRATORY_TRACT | Status: DC | PRN

## 2015-09-06 NOTE — Assessment & Plan Note (Signed)
discussed

## 2015-09-06 NOTE — Assessment & Plan Note (Addendum)
Chronic - wheezing CXR Singulair Proair prn Smoking discussed

## 2015-09-06 NOTE — Assessment & Plan Note (Addendum)
We discussed age appropriate health related issues, including available/recomended screening tests and vaccinations. We discussed a need for adhering to healthy diet and exercise. Labs/EKG were reviewed/ordered. All questions were answered.  Smoking discussed Labs CXR PPD

## 2015-09-06 NOTE — Progress Notes (Signed)
Pre visit review using our clinic review tool, if applicable. No additional management support is needed unless otherwise documented below in the visit note. 

## 2015-09-06 NOTE — Assessment & Plan Note (Signed)
Partial 2014 Dr Ruthann Cancer

## 2015-09-06 NOTE — Addendum Note (Signed)
Addended by: Cresenciano Lick on: 09/06/2015 11:42 AM   Modules accepted: Orders

## 2015-09-06 NOTE — Progress Notes (Signed)
Subjective:  Patient ID: Sarah Tyler, female    DOB: 1965-04-02  Age: 50 y.o. MRN: [id number]  CC: Annual Exam   HPI Sarah Tyler presents for a well exam. F/u asthma  Outpatient Prescriptions Prior to Visit  Medication Sig Dispense Refill  . albuterol (PROVENTIL HFA;VENTOLIN HFA) 108 (90 BASE) MCG/ACT inhaler Inhale 2 puffs into the lungs every 6 (six) hours as needed for wheezing. 1 Inhaler 0  . aspirin 81 MG tablet Take 81 mg by mouth daily.      . fish oil-omega-3 fatty acids 1000 MG capsule Take 2 g by mouth daily.     . methocarbamol (ROBAXIN) 500 MG tablet Take 1 tablet (500 mg total) by mouth 2 (two) times daily. 20 tablet 0  . naproxen (NAPROSYN) 500 MG tablet Take 1 tablet (500 mg total) by mouth 2 (two) times daily. 30 tablet 0  . cephALEXin (KEFLEX) 250 MG capsule Take 1 capsule (250 mg total) by mouth 4 (four) times daily. 29 capsule 0  . cetirizine (ZYRTEC) 10 MG tablet Take 10 mg by mouth daily as needed for allergies.     . famotidine (PEPCID) 20 MG tablet Take 1 tablet (20 mg total) by mouth 2 (two) times daily. 10 tablet 0  . ibuprofen (ADVIL,MOTRIN) 600 MG tablet Take 1 tablet (600 mg total) by mouth every 8 (eight) hours as needed. 30 tablet 0  . Multiple Vitamins-Minerals (MULTIVITAMIN WITH MINERALS) tablet Take 1 tablet by mouth daily.     No facility-administered medications prior to visit.    ROS Review of Systems  Constitutional: Negative for chills, activity change, appetite change, fatigue and unexpected weight change.  HENT: Negative for congestion, mouth sores and sinus pressure.   Eyes: Negative for visual disturbance.  Respiratory: Negative for cough and chest tightness.   Gastrointestinal: Negative for nausea and abdominal pain.  Genitourinary: Negative for frequency, difficulty urinating and vaginal pain.  Musculoskeletal: Positive for arthralgias. Negative for back pain and gait problem.  Skin: Negative for pallor and rash.  Neurological:  Negative for dizziness, tremors, weakness, numbness and headaches.  Psychiatric/Behavioral: Negative for suicidal ideas, confusion and sleep disturbance.    Objective:  BP 122/80 mmHg  Pulse 84  Ht 5' 0.05" (1.525 m)  Wt 170 lb (77.111 kg)  BMI 33.16 kg/m2  SpO2 97%  LMP 12/19/2012  BP Readings from Last 3 Encounters:  09/06/15 122/80  06/17/15 127/88  05/08/14 139/83    Wt Readings from Last 3 Encounters:  09/06/15 170 lb (77.111 kg)  04/16/13 166 lb (75.297 kg)  03/30/13 165 lb (74.844 kg)    Physical Exam  Constitutional: She appears well-developed. No distress.  HENT:  Head: Normocephalic.  Right Ear: External ear normal.  Left Ear: External ear normal.  Nose: Nose normal.  Mouth/Throat: Oropharynx is clear and moist.  Eyes: Conjunctivae are normal. Pupils are equal, round, and reactive to light. Right eye exhibits no discharge. Left eye exhibits no discharge.  Neck: Normal range of motion. Neck supple. No JVD present. No tracheal deviation present. No thyromegaly present.  Cardiovascular: Normal rate, regular rhythm and normal heart sounds.   Pulmonary/Chest: No stridor. No respiratory distress. She has wheezes.  Abdominal: Soft. Bowel sounds are normal. She exhibits no distension and no mass. There is no tenderness. There is no rebound and no guarding.  Musculoskeletal: She exhibits no edema or tenderness.  Lymphadenopathy:    She has no cervical adenopathy.  Neurological: She displays normal reflexes. No cranial  nerve deficit. She exhibits normal muscle tone. Coordination normal.  Skin: No rash noted. No erythema.  Psychiatric: She has a normal mood and affect. Her behavior is normal. Judgment and thought content normal.  L shoulder is sensitive w/ROM Obese  Lab Results  Component Value Date   WBC 13.6* 03/31/2013   HGB 12.5 03/31/2013   HCT 37.2 03/31/2013   PLT 315.0 03/31/2013   GLUCOSE 86 03/31/2013   CHOL 135 10/14/2012   TRIG 150.0* 10/14/2012    HDL 30.60* 10/14/2012   LDLCALC 74 10/14/2012   ALT 14 03/31/2013   AST 14 03/31/2013   NA 139 03/31/2013   K 3.9 03/31/2013   CL 106 03/31/2013   CREATININE 0.7 03/31/2013   BUN 11 03/31/2013   CO2 27 03/31/2013   TSH 1.10 03/31/2013   HGBA1C 5.6 10/14/2012    Ms Digital Screening Tomo Bilateral  05/31/2015  CLINICAL DATA:  Screening. EXAM: DIGITAL SCREENING BILATERAL MAMMOGRAM WITH 3D TOMO WITH CAD COMPARISON:  Previous exam(s). ACR Breast Density Category b: There are scattered areas of fibroglandular density. FINDINGS: There are no findings suspicious for malignancy. Images were processed with CAD. IMPRESSION: No mammographic evidence of malignancy. A result letter of this screening mammogram will be mailed directly to the patient. RECOMMENDATION: Screening mammogram in one year. (Code:SM-B-01Y) BI-RADS CATEGORY  1: Negative. Electronically Signed   By: Dorise Bullion III M.D   On: 05/31/2015 17:14    Assessment & Plan:   There are no diagnoses linked to this encounter. I have discontinued Ms. Vanderpol's multivitamin with minerals, cetirizine, cephALEXin, famotidine, and ibuprofen. I am also having her maintain her fish oil-omega-3 fatty acids, aspirin, albuterol, naproxen, methocarbamol, and montelukast.  Meds ordered this encounter  Medications  . montelukast (SINGULAIR) 10 MG tablet    Sig: Take 1 tablet by mouth daily.    Refill:  0     Follow-up: No Follow-up on file.  Walker Kehr, MD

## 2015-09-06 NOTE — Assessment & Plan Note (Signed)
Chronic Singulair

## 2015-09-08 LAB — TB SKIN TEST
Induration: 0 mm
TB Skin Test: NEGATIVE

## 2016-02-27 ENCOUNTER — Ambulatory Visit (INDEPENDENT_AMBULATORY_CARE_PROVIDER_SITE_OTHER): Payer: BLUE CROSS/BLUE SHIELD | Admitting: Internal Medicine

## 2016-02-27 ENCOUNTER — Encounter: Payer: Self-pay | Admitting: Internal Medicine

## 2016-02-27 VITALS — BP 130/92 | HR 78 | Temp 97.5°F | Wt 170.0 lb

## 2016-02-27 DIAGNOSIS — E669 Obesity, unspecified: Secondary | ICD-10-CM

## 2016-02-27 DIAGNOSIS — Z23 Encounter for immunization: Secondary | ICD-10-CM

## 2016-02-27 DIAGNOSIS — Z1211 Encounter for screening for malignant neoplasm of colon: Secondary | ICD-10-CM | POA: Diagnosis not present

## 2016-02-27 DIAGNOSIS — J4520 Mild intermittent asthma, uncomplicated: Secondary | ICD-10-CM

## 2016-02-27 DIAGNOSIS — Z720 Tobacco use: Secondary | ICD-10-CM | POA: Diagnosis not present

## 2016-02-27 DIAGNOSIS — Z8041 Family history of malignant neoplasm of ovary: Secondary | ICD-10-CM

## 2016-02-27 DIAGNOSIS — Z90710 Acquired absence of both cervix and uterus: Secondary | ICD-10-CM | POA: Diagnosis not present

## 2016-02-27 MED ORDER — VARENICLINE TARTRATE 0.5 MG X 11 & 1 MG X 42 PO MISC: ORAL | 0 refills | Status: DC

## 2016-02-27 MED ORDER — VARENICLINE TARTRATE 1 MG PO TABS: 1.0000 mg | ORAL_TABLET | Freq: Two times a day (BID) | ORAL | 4 refills | Status: DC

## 2016-02-27 NOTE — Assessment & Plan Note (Signed)
Proair, Singulair Stop smoking is needed

## 2016-02-27 NOTE — Assessment & Plan Note (Signed)
Wt Readings from Last 3 Encounters:  02/27/16 170 lb (77.1 kg)  09/06/15 170 lb (77.1 kg)  04/16/13 166 lb (75.3 kg)

## 2016-02-27 NOTE — Patient Instructions (Signed)
Mediterranean Diet A Mediterranean diet refers to food and lifestyle choices that are based on the traditions of countries located on the [place]. This way of eating has been shown to help prevent certain conditions and improve outcomes for people who have chronic diseases, like kidney disease and heart disease. What are tips for following this plan? Lifestyle  Cook and eat meals together with your family, when possible.  Drink enough fluid to keep your urine clear or pale yellow.  Be physically active every day. This includes:  Aerobic exercise like running or swimming.  Leisure activities like gardening, walking, or housework.  Get 7-8 hours of sleep each night.  If recommended by your health care provider, drink red wine in moderation. This means 1 glass a day for nonpregnant women and 2 glasses a day for men. A glass of wine equals 5 oz (150 mL). Reading food labels  Check the serving size of packaged foods. For foods such as rice and pasta, the serving size refers to the amount of cooked product, not dry.  Check the total fat in packaged foods. Avoid foods that have saturated fat or trans fats.  Check the ingredients list for added sugars, such as corn syrup. Shopping  At the grocery store, buy most of your food from the areas near the walls of the store. This includes:  Fresh fruits and vegetables (produce).  Grains, beans, nuts, and seeds. Some of these may be available in unpackaged forms or large amounts (in bulk).  Fresh seafood.  Poultry and eggs.  Low-fat dairy products.  Buy whole ingredients instead of prepackaged foods.  Buy fresh fruits and vegetables in-season from local farmers markets.  Buy frozen fruits and vegetables in resealable bags.  If you do not have access to quality fresh seafood, buy precooked frozen shrimp or canned fish, such as tuna, salmon, or sardines.  Buy small amounts of raw or cooked vegetables, salads, or olives from the  deli or salad bar at your store.  Stock your pantry so you always have certain foods on hand, such as olive oil, canned tuna, canned tomatoes, rice, pasta, and beans. Cooking  Cook foods with extra-virgin olive oil instead of using butter or other vegetable oils.  Have meat as a side dish, and have vegetables or grains as your main dish. This means having meat in small portions or adding small amounts of meat to foods like pasta or stew.  Use beans or vegetables instead of meat in common dishes like chili or lasagna.  Experiment with different cooking methods. Try roasting or broiling vegetables instead of steaming or sauteing them.  Add frozen vegetables to soups, stews, pasta, or rice.  Add nuts or seeds for added healthy fat at each meal. You can add these to yogurt, salads, or vegetable dishes.  Marinate fish or vegetables using olive oil, lemon juice, garlic, and fresh herbs. Meal planning  Plan to eat 1 vegetarian meal one day each week. Try to work up to 2 vegetarian meals, if possible.  Eat seafood 2 or more times a week.  Have healthy snacks readily available, such as:  Vegetable sticks with hummus.  Greek yogurt.  Fruit and nut trail mix.  Eat balanced meals throughout the week. This includes:  Fruit: 2-3 servings a day  Vegetables: 4-5 servings a day  Low-fat dairy: 2 servings a day  Fish, poultry, or lean meat: 1 serving a day  Beans and legumes: 2 or more servings a week  Nuts   and seeds: 1-2 servings a day  Whole grains: 6-8 servings a day  Extra-virgin olive oil: 3-4 servings a day  Limit red meat and sweets to only a few servings a month What are my food choices?  Mediterranean diet  Recommended  Grains: Whole-grain pasta. Brown rice. Bulgar wheat. Polenta. Couscous. Whole-wheat bread. Oatmeal. Quinoa.  Vegetables: Artichokes. Beets. Broccoli. Cabbage. Carrots. Eggplant. Green beans. Chard. Kale. Spinach. Onions. Leeks. Peas. Squash.  Tomatoes. Peppers. Radishes.  Fruits: Apples. Apricots. Avocado. Berries. Bananas. Cherries. Dates. Figs. Grapes. Lemons. Melon. Oranges. Peaches. Plums. Pomegranate.  Meats and other protein foods: Beans. Almonds. Sunflower seeds. Pine nuts. Peanuts. Cod. Salmon. Scallops. Shrimp. Tuna. Tilapia. Clams. Oysters. Eggs.  Dairy: Low-fat milk. Cheese. Greek yogurt.  Beverages: Water. Red wine. Herbal tea.  Fats and oils: Extra virgin olive oil. Avocado oil. Grape seed oil.  Sweets and desserts: Greek yogurt with honey. Baked apples. Poached pears. Trail mix.  Seasoning and other foods: Basil. Cilantro. Coriander. Cumin. Mint. Parsley. Sage. Rosemary. Tarragon. Garlic. Oregano. Thyme. Pepper. Balsalmic vinegar. Tahini. Hummus. Tomato sauce. Olives. Mushrooms.  Limit these  Grains: Prepackaged pasta or rice dishes. Prepackaged cereal with added sugar.  Vegetables: Deep fried potatoes (french fries).  Fruits: Fruit canned in syrup.  Meats and other protein foods: Beef. Pork. Lamb. Poultry with skin. Hot dogs. Bacon.  Dairy: Ice cream. Sour cream. Whole milk.  Beverages: Juice. Sugar-sweetened soft drinks. Beer. Liquor and spirits.  Fats and oils: Butter. Canola oil. Vegetable oil. Beef fat (tallow). Lard.  Sweets and desserts: Cookies. Cakes. Pies. Candy.  Seasoning and other foods: Mayonnaise. Premade sauces and marinades.  The items listed may not be a complete list. Talk with your dietitian about what dietary choices are right for you. Summary  The Mediterranean diet includes both food and lifestyle choices.  Eat a variety of fresh fruits and vegetables, beans, nuts, seeds, and whole grains.  Limit the amount of red meat and sweets that you eat.  Talk with your health care provider about whether it is safe for you to drink red wine in moderation. This means 1 glass a day for nonpregnant women and 2 glasses a day for men. A glass of wine equals 5 oz (150 mL). This information  is not intended to replace advice given to you by your health care provider. Make sure you discuss any questions you have with your health care provider. Document Released: 10/27/2015 Document Revised: 11/29/2015 Document Reviewed: 10/27/2015 Elsevier Interactive Patient Education  2017 [employer] Inc.  

## 2016-02-27 NOTE — Assessment & Plan Note (Signed)
Fam h/o ovar Ca - mother (52 yo)

## 2016-02-27 NOTE — Assessment & Plan Note (Signed)
Cologuard info Colonoscopy adviced

## 2016-02-27 NOTE — Progress Notes (Signed)
Subjective:  Patient ID: Sarah Tyler, female    DOB: [birth year]  Age: 50 y.o. MRN: [id number]  CC: No chief complaint on file.   HPI Sarah Tyler presents for asthma, smoking, fam h/o ovar ca f/u  Outpatient Medications Prior to Visit  Medication Sig Dispense Refill  . albuterol (PROVENTIL HFA;VENTOLIN HFA) 108 (90 Base) MCG/ACT inhaler Inhale 2 puffs into the lungs every 4 (four) hours as needed for wheezing. 1 Inhaler 5  . aspirin 81 MG tablet Take 81 mg by mouth daily.      . cholecalciferol (VITAMIN D) 1000 units tablet Take 1 tablet (1,000 Units total) by mouth daily. 100 tablet 3  . fish oil-omega-3 fatty acids 1000 MG capsule Take 2 g by mouth daily.     . methocarbamol (ROBAXIN) 500 MG tablet Take 1 tablet (500 mg total) by mouth 2 (two) times daily. 60 tablet 1  . montelukast (SINGULAIR) 10 MG tablet Take 1 tablet (10 mg total) by mouth daily. 90 tablet 3  . naproxen (NAPROSYN) 500 MG tablet Take 1 tablet (500 mg total) by mouth 2 (two) times daily. 30 tablet 0   No facility-administered medications prior to visit.     ROS Review of Systems  Constitutional: Negative for activity change, appetite change, chills, fatigue and unexpected weight change.  HENT: Negative for congestion, mouth sores and sinus pressure.   Eyes: Negative for visual disturbance.  Respiratory: Positive for cough. Negative for chest tightness.   Gastrointestinal: Negative for abdominal pain and nausea.  Genitourinary: Negative for difficulty urinating, frequency and vaginal pain.  Musculoskeletal: Negative for back pain and gait problem.  Skin: Negative for pallor and rash.  Neurological: Negative for dizziness, tremors, weakness, numbness and headaches.  Psychiatric/Behavioral: Negative for confusion and sleep disturbance.    Objective:  BP (!) 130/92   Pulse 78   Temp 97.5 F (36.4 C)   Wt 170 lb (77.1 kg)   LMP 12/26/2012   SpO2 100%   BMI 33.15 kg/m   BP Readings from Last 3  Encounters:  02/27/16 (!) 130/92  09/06/15 122/80  06/17/15 127/88    Wt Readings from Last 3 Encounters:  02/27/16 170 lb (77.1 kg)  09/06/15 170 lb (77.1 kg)  04/16/13 166 lb (75.3 kg)    Physical Exam  Constitutional: She appears well-developed. No distress.  HENT:  Head: Normocephalic.  Right Ear: External ear normal.  Left Ear: External ear normal.  Nose: Nose normal.  Mouth/Throat: Oropharynx is clear and moist.  Eyes: Conjunctivae are normal. Pupils are equal, round, and reactive to light. Right eye exhibits no discharge. Left eye exhibits no discharge.  Neck: Normal range of motion. Neck supple. No JVD present. No tracheal deviation present. No thyromegaly present.  Cardiovascular: Normal rate, regular rhythm and normal heart sounds.   Pulmonary/Chest: No stridor. No respiratory distress. She has no wheezes.  Abdominal: Soft. Bowel sounds are normal. She exhibits no distension and no mass. There is no tenderness. There is no rebound and no guarding.  Musculoskeletal: She exhibits no edema or tenderness.  Lymphadenopathy:    She has no cervical adenopathy.  Neurological: She displays normal reflexes. No cranial nerve deficit. She exhibits normal muscle tone. Coordination normal.  Skin: No rash noted. No erythema.  Psychiatric: She has a normal mood and affect. Her behavior is normal. Judgment and thought content normal.    Lab Results  Component Value Date   WBC 13.6 (H) 03/31/2013   HGB 12.5 03/31/2013  HCT 37.2 03/31/2013   PLT 315.0 03/31/2013   GLUCOSE 86 03/31/2013   CHOL 135 10/14/2012   TRIG 150.0 (H) 10/14/2012   HDL 30.60 (L) 10/14/2012   LDLCALC 74 10/14/2012   ALT 14 03/31/2013   AST 14 03/31/2013   NA 139 03/31/2013   K 3.9 03/31/2013   CL 106 03/31/2013   CREATININE 0.7 03/31/2013   BUN 11 03/31/2013   CO2 27 03/31/2013   TSH 1.10 03/31/2013   HGBA1C 5.6 10/14/2012    Dg Chest 2 View  Result Date: 09/06/2015 CLINICAL DATA:  Chronic cough,  congestion, wheezing. EXAM: CHEST  2 VIEW COMPARISON:  07/15/2008 FINDINGS: Heart and mediastinal contours are within normal limits. No focal opacities or effusions. No acute bony abnormality. Mild peribronchial thickening IMPRESSION: Mild bronchitic changes. Electronically Signed   By: Rolm Baptise M.D.   On: 09/06/2015 10:53    Assessment & Plan:   Diagnoses and all orders for this visit:  S/P vaginal hysterectomy  Family history of ovarian cancer  Obesity (BMI 30-39.9)  Colon cancer screening  Tobacco user  Mild intermittent asthma without complication   I am having Ms. Deoliveira maintain her fish oil-omega-3 fatty acids, aspirin, naproxen, cholecalciferol, methocarbamol, montelukast, and albuterol.  No orders of the defined types were placed in this encounter.    Follow-up: No Follow-up on file.  Walker Kehr, MD

## 2016-02-27 NOTE — Assessment & Plan Note (Signed)
Fam h/o ovar Ca - mother (52 yo) GYN ref to discuss options

## 2016-02-27 NOTE — Assessment & Plan Note (Signed)
Chantix-discussed 

## 2016-02-27 NOTE — Progress Notes (Signed)
Pre visit review using our clinic review tool, if applicable. No additional management support is needed unless otherwise documented below in the visit note. 

## 2016-03-01 ENCOUNTER — Telehealth: Payer: Self-pay | Admitting: Emergency Medicine

## 2016-03-01 NOTE — Telephone Encounter (Signed)
Pt came in on Monday for a visit. She was suppose to get a prescription called in for Singulair and a muscle relaxer. The drug store doesn't have them. All those please be called into [facility]. Please advise thanks.

## 2016-03-02 MED ORDER — MONTELUKAST SODIUM 10 MG PO TABS: 10.0000 mg | ORAL_TABLET | Freq: Every day | ORAL | 1 refills | Status: DC

## 2016-03-02 MED ORDER — METHOCARBAMOL 500 MG PO TABS: 500.0000 mg | ORAL_TABLET | Freq: Two times a day (BID) | ORAL | 1 refills | Status: DC

## 2016-03-02 NOTE — Telephone Encounter (Signed)
Refills sent to [facility].../lmb 

## 2016-05-01 ENCOUNTER — Telehealth (HOSPITAL_COMMUNITY): Payer: Self-pay | Admitting: *Deleted

## 2016-05-01 NOTE — Telephone Encounter (Signed)
Telephoned patient at home number and left message to return call to [facility] 

## 2016-06-19 ENCOUNTER — Encounter: Payer: Self-pay | Admitting: Family Medicine

## 2016-06-19 ENCOUNTER — Other Ambulatory Visit: Payer: Self-pay | Admitting: Obstetrics and Gynecology

## 2016-06-19 ENCOUNTER — Ambulatory Visit (INDEPENDENT_AMBULATORY_CARE_PROVIDER_SITE_OTHER): Payer: BLUE CROSS/BLUE SHIELD | Admitting: Family Medicine

## 2016-06-19 VITALS — BP 130/70 | HR 81 | Resp 12 | Ht 60.05 in | Wt 177.1 lb

## 2016-06-19 DIAGNOSIS — Z1231 Encounter for screening mammogram for malignant neoplasm of breast: Secondary | ICD-10-CM

## 2016-06-19 DIAGNOSIS — M542 Cervicalgia: Secondary | ICD-10-CM | POA: Diagnosis not present

## 2016-06-19 DIAGNOSIS — G5602 Carpal tunnel syndrome, left upper limb: Secondary | ICD-10-CM | POA: Diagnosis not present

## 2016-06-19 DIAGNOSIS — M25511 Pain in right shoulder: Secondary | ICD-10-CM | POA: Diagnosis not present

## 2016-06-19 DIAGNOSIS — R221 Localized swelling, mass and lump, neck: Secondary | ICD-10-CM

## 2016-06-19 DIAGNOSIS — M25512 Pain in left shoulder: Secondary | ICD-10-CM

## 2016-06-19 MED ORDER — METHOCARBAMOL 500 MG PO TABS: 500.0000 mg | ORAL_TABLET | Freq: Three times a day (TID) | ORAL | 0 refills | Status: DC

## 2016-06-19 NOTE — Patient Instructions (Signed)
  [name] I have seen you today for an acute visit.  A few things to remember from today's visit:   Left carpal tunnel syndrome  Cervicalgia - Plan: methocarbamol (ROBAXIN) 500 MG tablet  Subcutaneous mass of neck - Plan: US Soft Tissue Head/Neck     Local heat and massage may help with next pain.  Carpal Tunnel Syndrome Carpal tunnel syndrome is a condition that causes pain in your hand and arm. The carpal tunnel is a narrow area that is on the palm side of your wrist. Repeated wrist motion or certain diseases may cause swelling in the tunnel. This swelling can pinch the main nerve in the wrist (median nerve). Follow these instructions at home: Please get a wrist splint:   Wear at night.  Loosen the splint if your fingers:  Become numb and tingle.  Turn blue and cold.  Keep the splint clean and dry. General instructions   Take over-the-counter and prescription medicines only as told by your doctor.  Rest your wrist from any activity that may be causing your pain. If needed, talk to your employer about changes that can be made in your work, such as getting a wrist pad to use while typing.  If directed, apply ice to the painful area:  Put ice in a plastic bag.  Place a towel between your skin and the bag.  Leave the ice on for 20 minutes, 2-3 times per day.  Keep all follow-up visits as told by your doctor. This is important.  Do any exercises as told by your doctor, physical therapist, or occupational therapist.   Contact a doctor if:  You have new symptoms.  Medicine does not help your pain.  Your symptoms get worse. This information is not intended to replace advice given to you by your health care provider. Make sure you discuss any questions you have with your health care provider. Document Released: 02/22/2011 Document Revised: 08/11/2015 Document Reviewed: 07/21/2014 Elsevier Interactive Patient Education  2017 [employer].     Medications prescribed today are intended for short period of time and will not be refill upon request, a follow up appointment might be necessary to discuss continuation of of treatment if appropriate.     In general please monitor for signs of worsening symptoms and seek immediate medical attention if any concerning.  If symptoms are not resolved in a few days/weeks you should schedule a follow up appointment with your doctor, before if needed.  Please be sure you have an appointment already scheduled with your PCP before you leave today.

## 2016-06-19 NOTE — Progress Notes (Signed)
HPI:   ACUTE VISIT:  Chief Complaint  Patient presents with  . knot on back of neck    [name] is a 51 y.o. female, who is here today with several complaints: cervical pain,"knot" on neck,joint pain,headache among some.  At least a year of shoulders pain, cervicalgia,and upper back pain. Tightness,sore.  2-3 days ago she noted "knots" on cervical area when she was rubbing area. She is not sure if she has had it for longer. She denies neck injury.  + 2-3 days of occipital headache and a week of worsening upper back pain. She denies associated visual changes,nausea,or vomiting.  Pain is mild to moderate, exacerbated by movement and alleviated by rest. In the past she has taken muscle relaxants and helped with upper back pain.   Hand arthralgias and left hand numbness. Intermittent,alleviated by hand movements. Frequently happens at night, no problems during the day. Her job requires computer use/typing.  She denies weakness.  Left handed.   Review of Systems  Constitutional: Positive for fatigue. Negative for activity change, appetite change, fever and unexpected weight change.  HENT: Negative for mouth sores, nosebleeds and trouble swallowing.   Eyes: Negative for photophobia, redness and visual disturbance.  Respiratory: Negative for cough, shortness of breath and wheezing.   Cardiovascular: Negative for chest pain, palpitations and leg swelling.  Gastrointestinal: Negative for abdominal pain, nausea and vomiting.       Negative for changes in bowel habits.  Endocrine: Negative for cold intolerance, heat intolerance, polydipsia, polyphagia and polyuria.  Musculoskeletal: Positive for arthralgias, back pain and neck pain. Negative for gait problem and joint swelling.  Skin: Negative for rash.  Neurological: Positive for numbness and headaches. Negative for syncope and weakness.  Hematological: Negative for adenopathy. Does not bruise/bleed easily.    Psychiatric/Behavioral: Negative for confusion. The patient is nervous/anxious.       Current Outpatient Prescriptions on File Prior to Visit  Medication Sig Dispense Refill  . albuterol (PROVENTIL HFA;VENTOLIN HFA) 108 (90 Base) MCG/ACT inhaler Inhale 2 puffs into the lungs every 4 (four) hours as needed for wheezing. 1 Inhaler 5  . aspirin 81 MG tablet Take 81 mg by mouth daily.      . cholecalciferol (VITAMIN D) 1000 units tablet Take 1 tablet (1,000 Units total) by mouth daily. 100 tablet 3  . fish oil-omega-3 fatty acids 1000 MG capsule Take 2 g by mouth daily.     . montelukast (SINGULAIR) 10 MG tablet Take 1 tablet (10 mg total) by mouth daily. 90 tablet 1  . naproxen (NAPROSYN) 500 MG tablet Take 1 tablet (500 mg total) by mouth 2 (two) times daily. 30 tablet 0  . varenicline (CHANTIX CONTINUING MONTH PAK) 1 MG tablet Take 1 tablet (1 mg total) by mouth 2 (two) times daily. 60 tablet 4  . varenicline (CHANTIX STARTING MONTH PAK) 0.5 MG X 11 & 1 MG X 42 tablet Take one 0.5 mg tablet by mouth once daily for 3 days, then increase to one 0.5 mg tablet twice daily for 4 days, then increase to one 1 mg tablet twice daily. 53 tablet 0   No current facility-administered medications on file prior to visit.      Past Medical History:  Diagnosis Date  . Allergy   . Fibroids   . Irregular bleeding    No Known Allergies  Social History   Social History  . Marital status: Single    Spouse name: N/A  .  Number of children: N/A  . Years of education: N/A   Social History Main Topics  . Smoking status: Current Every Day Smoker    Packs/day: 0.25    Types: Cigarettes  . Smokeless tobacco: Never Used  . Alcohol use Yes  . Drug use: No  . Sexual activity: Not Currently    Birth control/ protection: Surgical   Other Topics Concern  . None   Social History Narrative  . None    Vitals:   06/19/16 1416  BP: 130/70  Pulse: 81  Resp: 12  O2 sat 99% at RA. Body mass index is  34.53 kg/m.   Physical Exam  Nursing note and vitals reviewed. Constitutional: She is oriented to person, place, and time. She appears well-developed. She does not appear ill. No distress.  HENT:  Head: Atraumatic.  Eyes: Conjunctivae and EOM are normal.  Neck:    Cardiovascular: Normal rate and regular rhythm.   No murmur heard. Respiratory: Effort normal and breath sounds normal. No respiratory distress.  Musculoskeletal: She exhibits no edema.  Moderate tenderness upon palpation of left cervical paraspinal muscles, mild tenderness on left trapezium. No significant limitation cervical ROM, it elicits pain.  Shoulders: adequate active ROM with no pain elicited.  Soft mass mid upper cervical,no tender and seems to be extending to right side. See graphic neck for details.  No local edema or erythema appreciated, no suspicious lesions.  Left + Phalen and tinel.   Lymphadenopathy:    She has no cervical adenopathy.  Neurological: She is alert and oriented to person, place, and time. She has normal strength. No cranial nerve deficit. Coordination and gait normal.  Skin: Skin is warm. No rash noted. No erythema.  Psychiatric: Her mood appears anxious.  Well groomed, good eye contact.      ASSESSMENT AND PLAN:   Sarah Tyler was seen today for knot on back of neck.  Diagnoses and all orders for this visit:  Left carpal tunnel syndrome  Dx discussed as well as treatment options. Splint on left wrist to wear at night recommended. F/U with PCP,she may need EMG and/or referral to ortho of symptoms persist.   Cervicalgia  Chronic. Some side effects of muscle relaxants dicussed. Local heat and massage may also help.  Cervical MRI 04/2013: The visualized intracranial contents and cervical spinal cord and paraspinal soft tissues are normal. There is no facet arthritis in the cervical spine.   C1-2 and C2-3:  Normal.   C3-4:  Tiny central disc bulge with no neural  impingement.   C4-5:  Tiny central disc bulge with no neural impingement.   C5-6:  Tiny central disc bulge with no neural impingement.   C6-7: Slight disc space narrowing. Small broad-based disc bulge with no neural impingement.   -     methocarbamol (ROBAXIN) 500 MG tablet; Take 1 tablet (500 mg total) by mouth 3 (three) times daily.  Subcutaneous mass of neck  ? Lipoma. Further recommendations will be given according to imaging results.  -     US Soft Tissue Head/Neck; Future   Bilateral shoulder pain, unspecified chronicity  We dicussed possible causes. It could be radiated from cervical/trapezium pain. Today shoulder examination otherwise negative.   Return in about 2 months (around 08/19/2016) for PCP.     -Ms.Shaquetta D Rockhold was advised to return or notify a doctor immediately if symptoms worsen or new concerns arise.       Everet Flagg G. [place], MD  [facility]. [place]  office.

## 2016-06-19 NOTE — Progress Notes (Signed)
Pre visit review using our clinic review tool, if applicable. No additional management support is needed unless otherwise documented below in the visit note. 

## 2016-06-21 ENCOUNTER — Telehealth: Payer: Self-pay | Admitting: Internal Medicine

## 2016-06-28 ENCOUNTER — Ambulatory Visit
Admission: RE | Admit: 2016-06-28 | Discharge: 2016-06-28 | Disposition: A | Discharge status: Home / Self Care | Payer: No Typology Code available for payment source | Source: Ambulatory Visit | Attending: Family Medicine | Admitting: Family Medicine

## 2016-06-28 DIAGNOSIS — R221 Localized swelling, mass and lump, neck: Secondary | ICD-10-CM

## 2016-07-05 ENCOUNTER — Ambulatory Visit (HOSPITAL_COMMUNITY): Payer: Self-pay

## 2016-07-05 ENCOUNTER — Encounter (HOSPITAL_COMMUNITY): Payer: Self-pay

## 2016-07-05 ENCOUNTER — Other Ambulatory Visit: Payer: Self-pay | Admitting: Internal Medicine

## 2016-07-05 ENCOUNTER — Ambulatory Visit
Admission: RE | Admit: 2016-07-05 | Discharge: 2016-07-05 | Disposition: A | Discharge status: Home / Self Care | Payer: BLUE CROSS/BLUE SHIELD | Source: Ambulatory Visit | Attending: Internal Medicine | Admitting: Internal Medicine

## 2016-07-05 DIAGNOSIS — Z1231 Encounter for screening mammogram for malignant neoplasm of breast: Secondary | ICD-10-CM

## 2016-08-27 ENCOUNTER — Ambulatory Visit: Payer: No Typology Code available for payment source | Admitting: Internal Medicine

## 2016-08-28 ENCOUNTER — Ambulatory Visit (INDEPENDENT_AMBULATORY_CARE_PROVIDER_SITE_OTHER): Payer: BLUE CROSS/BLUE SHIELD | Admitting: Internal Medicine

## 2016-08-28 ENCOUNTER — Encounter: Payer: Self-pay | Admitting: Internal Medicine

## 2016-08-28 DIAGNOSIS — R202 Paresthesia of skin: Secondary | ICD-10-CM | POA: Diagnosis not present

## 2016-08-28 DIAGNOSIS — M542 Cervicalgia: Secondary | ICD-10-CM

## 2016-08-28 MED ORDER — NAPROXEN 500 MG PO TABS: 500.0000 mg | ORAL_TABLET | Freq: Two times a day (BID) | ORAL | 1 refills | Status: DC | PRN

## 2016-08-28 MED ORDER — MONTELUKAST SODIUM 10 MG PO TABS: 10.0000 mg | ORAL_TABLET | Freq: Every day | ORAL | 3 refills | Status: DC

## 2016-08-28 MED ORDER — METHOCARBAMOL 500 MG PO TABS: 500.0000 mg | ORAL_TABLET | Freq: Three times a day (TID) | ORAL | 0 refills | Status: DC

## 2016-08-28 MED ORDER — NAPROXEN 500 MG PO TABS: 500.0000 mg | ORAL_TABLET | Freq: Two times a day (BID) | ORAL | 0 refills | Status: DC

## 2016-08-28 NOTE — Assessment & Plan Note (Addendum)
L side (LUE and LLE) ?etiol: MSK pains vs other She works at [employer] - she is L handed Neurol ref Labs PT Robaxin/Naproxen

## 2016-08-28 NOTE — Progress Notes (Signed)
Subjective:  Patient ID: Sarah Tyler, female    DOB: 04/01/1965  Age: 51 y.o. MRN: [id number]  CC: No chief complaint on file.   HPI Sarah Tyler presents for L neck pain irrad to the L arm and L leg x 2+ weeks. Not better w/Naproxen. Feels strange. Feels like someone hit her on the arm and on the leg... C/o lump on the neck She works at [employer] - she is L handed  Outpatient Medications Prior to Visit  Medication Sig Dispense Refill  . albuterol (PROVENTIL HFA;VENTOLIN HFA) 108 (90 Base) MCG/ACT inhaler Inhale 2 puffs into the lungs every 4 (four) hours as needed for wheezing. 1 Inhaler 5  . aspirin 81 MG tablet Take 81 mg by mouth daily.      . cholecalciferol (VITAMIN D) 1000 units tablet Take 1 tablet (1,000 Units total) by mouth daily. 100 tablet 3  . fish oil-omega-3 fatty acids 1000 MG capsule Take 2 g by mouth daily.     . methocarbamol (ROBAXIN) 500 MG tablet Take 1 tablet (500 mg total) by mouth 3 (three) times daily. 45 tablet 0  . montelukast (SINGULAIR) 10 MG tablet Take 1 tablet (10 mg total) by mouth daily. 90 tablet 1  . naproxen (NAPROSYN) 500 MG tablet Take 1 tablet (500 mg total) by mouth 2 (two) times daily. 30 tablet 0  . varenicline (CHANTIX CONTINUING MONTH PAK) 1 MG tablet Take 1 tablet (1 mg total) by mouth 2 (two) times daily. 60 tablet 4  . varenicline (CHANTIX STARTING MONTH PAK) 0.5 MG X 11 & 1 MG X 42 tablet Take one 0.5 mg tablet by mouth once daily for 3 days, then increase to one 0.5 mg tablet twice daily for 4 days, then increase to one 1 mg tablet twice daily. 53 tablet 0   No facility-administered medications prior to visit.     ROS Review of Systems  Constitutional: Negative for activity change, appetite change, chills, fatigue and unexpected weight change.  HENT: Negative for congestion, mouth sores and sinus pressure.   Eyes: Negative for visual disturbance.  Respiratory: Negative for cough and chest tightness.     Gastrointestinal: Negative for abdominal pain and nausea.  Genitourinary: Negative for difficulty urinating, frequency and vaginal pain.  Musculoskeletal: Positive for arthralgias, myalgias, neck pain and neck stiffness. Negative for back pain and gait problem.  Skin: Negative for pallor and rash.  Neurological: Negative for dizziness, tremors, weakness, numbness and headaches.  Psychiatric/Behavioral: Negative for confusion and sleep disturbance.    Objective:  BP 120/78 (BP Location: Left Arm, Patient Position: Sitting, Cuff Size: Normal)   Pulse 81   Temp 98.3 F (36.8 C) (Oral)   Ht 5' 0.05" (1.525 m)   Wt 172 lb (78 kg)   LMP 12/26/2012   SpO2 99%   BMI 33.54 kg/m   BP Readings from Last 3 Encounters:  08/28/16 120/78  06/19/16 130/70  02/27/16 (!) 130/92    Wt Readings from Last 3 Encounters:  08/28/16 172 lb (78 kg)  06/19/16 177 lb 2 oz (80.3 kg)  02/27/16 170 lb (77.1 kg)    Physical Exam  Constitutional: She appears well-developed. No distress.  HENT:  Head: Normocephalic.  Right Ear: External ear normal.  Left Ear: External ear normal.  Nose: Nose normal.  Mouth/Throat: Oropharynx is clear and moist.  Eyes: Conjunctivae are normal. Pupils are equal, round, and reactive to light. Right eye exhibits no discharge. Left eye  exhibits no discharge.  Neck: Normal range of motion. Neck supple. No JVD present. No tracheal deviation present. No thyromegaly present.  Cardiovascular: Normal rate, regular rhythm and normal heart sounds.   Pulmonary/Chest: No stridor. No respiratory distress. She has no wheezes.  Abdominal: Soft. Bowel sounds are normal. She exhibits no distension and no mass. There is no tenderness. There is no rebound and no guarding.  Musculoskeletal: She exhibits tenderness. She exhibits no edema.  Lymphadenopathy:    She has no cervical adenopathy.  Neurological: She displays normal reflexes. No cranial nerve deficit. She exhibits normal muscle  tone. Coordination normal.  Skin: No rash noted. No erythema.  Psychiatric: She has a normal mood and affect. Her behavior is normal. Judgment and thought content normal.  MS OK x4 extr No pronator drift CN 2-12 WNL ?lipoma omn the posterior neck  Lab Results  Component Value Date   WBC 13.6 (H) 03/31/2013   HGB 12.5 03/31/2013   HCT 37.2 03/31/2013   PLT 315.0 03/31/2013   GLUCOSE 86 03/31/2013   CHOL 135 10/14/2012   TRIG 150.0 (H) 10/14/2012   HDL 30.60 (L) 10/14/2012   LDLCALC 74 10/14/2012   ALT 14 03/31/2013   AST 14 03/31/2013   NA 139 03/31/2013   K 3.9 03/31/2013   CL 106 03/31/2013   CREATININE 0.7 03/31/2013   BUN 11 03/31/2013   CO2 27 03/31/2013   TSH 1.10 03/31/2013   HGBA1C 5.6 10/14/2012    Mm Screening Breast Tomo Bilateral  Result Date: 07/06/2016 CLINICAL DATA:  Screening. EXAM: 2D DIGITAL SCREENING BILATERAL MAMMOGRAM WITH CAD AND ADJUNCT TOMO COMPARISON:  Previous exam(s). ACR Breast Density Category b: There are scattered areas of fibroglandular density. FINDINGS: There are no findings suspicious for malignancy. Images were processed with CAD. IMPRESSION: No mammographic evidence of malignancy. A result letter of this screening mammogram will be mailed directly to the patient. RECOMMENDATION: Screening mammogram in one year. (Code:SM-B-01Y) BI-RADS CATEGORY  1: Negative. Electronically Signed   By: Abelardo Diesel M.D.   On: 07/06/2016 09:46    Assessment & Plan:   There are no diagnoses linked to this encounter. I am having Ms. Napier maintain her fish oil-omega-3 fatty acids, aspirin, naproxen, cholecalciferol, albuterol, varenicline, varenicline, montelukast, and methocarbamol.  No orders of the defined types were placed in this encounter.    Follow-up: No Follow-up on file.  Walker Kehr, MD

## 2016-08-28 NOTE — Assessment & Plan Note (Signed)
PT ref Naproxen Robaxin

## 2016-08-29 ENCOUNTER — Encounter: Payer: Self-pay | Admitting: Neurology

## 2016-09-03 ENCOUNTER — Encounter: Payer: Self-pay | Admitting: Neurology

## 2016-09-03 ENCOUNTER — Ambulatory Visit (INDEPENDENT_AMBULATORY_CARE_PROVIDER_SITE_OTHER): Payer: BLUE CROSS/BLUE SHIELD | Admitting: Neurology

## 2016-09-03 VITALS — BP 108/68 | HR 107 | Ht 60.0 in | Wt 172.4 lb

## 2016-09-03 DIAGNOSIS — G5602 Carpal tunnel syndrome, left upper limb: Secondary | ICD-10-CM | POA: Diagnosis not present

## 2016-09-03 DIAGNOSIS — M542 Cervicalgia: Secondary | ICD-10-CM | POA: Diagnosis not present

## 2016-09-03 DIAGNOSIS — Z720 Tobacco use: Secondary | ICD-10-CM | POA: Diagnosis not present

## 2016-09-03 DIAGNOSIS — M2550 Pain in unspecified joint: Secondary | ICD-10-CM

## 2016-09-03 NOTE — Progress Notes (Signed)
[facility] Clinic Note - Initial Visit   Date: 09/03/16  Sarah Tyler MRN: [id number] DOB: 1966/03/17   Dear Dr. Luther Redo:  Thank you for your kind referral of Sarah Tyler for consultation of left sided paresthesias. Although her history is well known to you, please allow [place] to reiterate it for the purpose of our medical record. The patient was accompanied to the clinic by self.    History of Present Illness: Sarah Tyler is a 51 y.o. left-handed African American female with tobacco use and bronchitis presenting for evaluation of left sided pain.    Starting around early 2017, she began having left shoulder achy and throbbing pain. More recently, she has achy and sore pain involving the left shoulder, wrist, knee, hip, and ankle which is worse with movement.  She has tried naproxen and robaxin which helps.  Soft tissue massage also helps.  She has some neck tightness and also noticed a new knot on the base of the neck on the left.  There is no associated pain over the area, but when she stretches her neck, it has tension in the area.  She denies any shooting neck pain or radicular upper/lower extremity pain. There is mild soreness to palpation over the joints, no swelling or redness.  She does not have muscle pain to palpation.   She also complain of numbness and tingling over entire left hand, which is intermittent.  She often wakes up at night trying to shake her arm awake.  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   Out-side paper records, electronic medical record, and images have been reviewed where available and summarized as:  Lab Results  Component Value Date   TSH 1.10 03/31/2013   Lab Results  Component Value Date   [id number] 485 03/31/2013   Lab Results  Component Value Date   HGBA1C 5.6 10/14/2012     Past Medical History:  Diagnosis Date  . Allergy   . Fibroids   . Irregular bleeding      Past Surgical History:  Procedure Laterality Date  . TUBAL LIGATION    . VAGINAL HYSTERECTOMY N/A 01/05/2013   Procedure: TOTAL VAGINAL HYSTERECTOMY ;  Surgeon: Osborne Oman, MD;  Location: [facility];  Service: Gynecology;  Laterality: N/A;     Medications:  Outpatient Encounter Prescriptions as of 09/03/2016  Medication Sig  . albuterol (PROVENTIL HFA;VENTOLIN HFA) 108 (90 Base) MCG/ACT inhaler Inhale 2 puffs into the lungs every 4 (four) hours as needed for wheezing.  Marland Kitchen aspirin 81 MG tablet Take 81 mg by mouth daily.    . cholecalciferol (VITAMIN D) 1000 units tablet Take 1 tablet (1,000 Units total) by mouth daily.  . Cyanocobalamin (VITAMIN B-12 PO) Take by mouth.  . fish oil-omega-3 fatty acids 1000 MG capsule Take 2 g by mouth daily.   . methocarbamol (ROBAXIN) 500 MG tablet Take 1 tablet (500 mg total) by mouth 3 (three) times daily.  . montelukast (SINGULAIR) 10 MG tablet Take 1 tablet (10 mg total) by mouth daily.  . Multiple Vitamin (MULTIVITAMIN) tablet Take 1 tablet by mouth daily.  . naproxen (NAPROSYN) 500 MG tablet Take 1 tablet (500 mg total) by mouth 2 (two) times daily as needed.   No facility-administered encounter medications on file as of 09/03/2016.      Allergies: No Known Allergies  Family History: Family History  Problem Relation Age of Onset  . Hypertension  Mother   . Ovarian cancer Mother        cervical  . HIV/AIDS Father     Social History: Social History  Substance Use Topics  . Smoking status: Current Every Day Smoker    Packs/day: 0.25    Types: Cigarettes  . Smokeless tobacco: Never Used  . Alcohol use Yes     Comment: socially, once every 2-3 months   Social History   Social History Narrative   Lives in a one story home.  Her 53 month old granddaughter lives with her.     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     Education: 1 year of college.     Review of Systems:  CONSTITUTIONAL: No fevers, chills,  night sweats, or weight loss.   EYES: No visual changes or eye pain ENT: No hearing changes.  No history of nose bleeds.   RESPIRATORY: No cough, wheezing and shortness of breath.   CARDIOVASCULAR: Negative for chest pain, and palpitations.   GI: Negative for abdominal discomfort, blood in stools or black stools.  No recent change in bowel habits.   GU:  No history of incontinence.   MUSCLOSKELETAL: +history of joint pain or swelling.  No myalgias.   SKIN: Negative for lesions, rash, and itching.   HEMATOLOGY/ONCOLOGY: Negative for prolonged bleeding, bruising easily, and swollen nodes.  No history of cancer.   ENDOCRINE: Negative for cold or heat intolerance, polydipsia or goiter.   PSYCH:  No depression or anxiety symptoms.   NEURO: As Above.   Vital Signs:  BP 108/68   Pulse (!) 107   Ht 5' (1.524 m)   Wt 172 lb 7 oz (78.2 kg)   LMP 12/26/2012   SpO2 99%   BMI 33.68 kg/m    General Medical Exam:   General:  Well appearing, comfortable.   Eyes/ENT: see cranial nerve examination.   Neck: Left posterior neck soft nodular mass, nontender or no erythema. ?lipoma  Full range of motion without tenderness.  No carotid bruits. Respiratory:  Clear to auscultation, good air entry bilaterally.   Cardiac:  Regular rate and rhythm, no murmur.   Extremities:  No deformities, edema, or skin discoloration.  Skin:  No rashes or lesions.  Neurological Exam: MENTAL STATUS including orientation to time, place, person, recent and remote memory, attention span and concentration, language, and fund of knowledge is normal.  Speech is not dysarthric.  CRANIAL NERVES: II:  No visual field defects.  Unremarkable fundi.   III-IV-VI: Pupils equal round and reactive to light.  Normal conjugate, extra-ocular eye movements in all directions of gaze.  No nystagmus.  No ptosis.   V:  Normal facial sensation.     VII:  Normal facial symmetry and movements.  No pathologic facial reflexes.  VIII:  Normal  hearing and vestibular function.   IX-X:  Normal palatal movement.   XI:  Normal shoulder shrug and head rotation.   XII:  Normal tongue strength and range of motion, no deviation or fasciculation.  MOTOR:  No atrophy, fasciculations or abnormal movements.  No pronator drift.  Tone is normal.    Right Upper Extremity:    Left Upper Extremity:    Deltoid  5/5   Deltoid  5/5   Biceps  5/5   Biceps  5/5   Triceps  5/5   Triceps  5/5   Wrist extensors  5/5   Wrist extensors  5/5   Wrist flexors  5/5  Wrist flexors  5/5   Finger extensors  5/5   Finger extensors  5/5   Finger flexors  5/5   Finger flexors  5/5   Dorsal interossei  5/5   Dorsal interossei  5/5   Abductor pollicis  5/5   Abductor pollicis  5/5   Tone (Ashworth scale)  0  Tone (Ashworth scale)  0   Right Lower Extremity:    Left Lower Extremity:    Hip flexors  5/5   Hip flexors  5/5   Hip extensors  5/5   Hip extensors  5/5   Knee flexors  5/5   Knee flexors  5/5   Knee extensors  5/5   Knee extensors  5/5   Dorsiflexors  5/5   Dorsiflexors  5/5   Plantarflexors  5/5   Plantarflexors  5/5   Toe extensors  5/5   Toe extensors  5/5   Toe flexors  5/5   Toe flexors  5/5   Tone (Ashworth scale)  0  Tone (Ashworth scale)  0   MSRs:  Right                                                                 Left brachioradialis 2+  brachioradialis 2+  biceps 2+  biceps 2+  triceps 2+  triceps 2+  patellar 2+  patellar 2+  ankle jerk 2+  ankle jerk 2+  Hoffman no  Hoffman no  plantar response down  plantar response down   SENSORY:  Normal and symmetric perception of light touch, pinprick, vibration, and proprioception.  Tinel's sign is positive at the left wrist.   COORDINATION/GAIT: Normal finger-to- nose-finger.  Intact rapid alternating movements bilaterally.  Able to rise from a chair without using arms.  Gait narrow based and stable. Tandem and stressed gait intact.    IMPRESSION: 1.  Left carpal tunnel syndrome 2.   Polyarthralgias (left shoulder, wrist, hip, knee, and ankle) 3.  Tobacco use 4.  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  Cervicalgia, continue robaxin, NSAIDs, and PT (she is awaiting appointment time)  Although she has left sided symptoms, the nature of her pain (achy, throbbing) is more suggestive of musculoskeletal pain (?arthralgias) and does have characteristic neuropathic features.  She will have NCS/EMG of the left arm and leg to assess her symptoms, but overall suspicion for radicular syndrome is low.  For her suspected carpal tunnel syndrome, I recommend that she start using a wrist splint and avoid over flexion at the wrist.    Recommend she follow-up with PCP for polyarthralgias.  Smoking cessation instruction/counseling given:  counseled patient on the dangers of tobacco use, advised patient to stop smoking, and reviewed strategies to maximize success  Further recommendations will be based on the results of her testing   The duration of this appointment visit was 35 minutes of face-to-face time with the patient.  Greater than 50% of this time was spent in counseling, explanation of diagnosis, planning of further management, and coordination of care.   Thank you for allowing me to participate in patient's care.  If I can answer any additional questions, I would be pleased to do so.    Sincerely,    Donika K. Posey Pronto, DO

## 2016-09-03 NOTE — Patient Instructions (Addendum)
NCS/EMG of the left arm and leg  Start using a wrist splint  Follow-up with Dr. Alain Marion for left sided joint pain  Encouraged to stop smoking

## 2016-09-04 ENCOUNTER — Encounter: Payer: BLUE CROSS/BLUE SHIELD | Admitting: Neurology

## 2016-09-04 DIAGNOSIS — Z029 Encounter for administrative examinations, unspecified: Secondary | ICD-10-CM

## 2016-09-05 ENCOUNTER — Encounter: Payer: Self-pay | Admitting: Neurology

## 2017-03-14 ENCOUNTER — Encounter: Payer: Self-pay | Admitting: Family Medicine

## 2017-03-14 ENCOUNTER — Ambulatory Visit: Payer: BLUE CROSS/BLUE SHIELD | Admitting: Family Medicine

## 2017-03-14 VITALS — BP 134/68 | HR 85 | Temp 98.1°F | Ht 60.0 in

## 2017-03-14 DIAGNOSIS — M25562 Pain in left knee: Secondary | ICD-10-CM

## 2017-03-14 MED ORDER — DICLOFENAC SODIUM 2 % TD SOLN: 1.0000 "application " | Freq: Two times a day (BID) | TRANSDERMAL | 3 refills | Status: DC

## 2017-03-14 MED ORDER — MELOXICAM 15 MG PO TABS: 15.0000 mg | ORAL_TABLET | Freq: Every day | ORAL | 0 refills | Status: DC

## 2017-03-14 NOTE — Patient Instructions (Addendum)
Please try mobic for 10 days straight and then as needed. Try the exercises.  Please follow up with me if your pain doesn't improve.  Please try to use ice and compression.

## 2017-03-14 NOTE — Progress Notes (Signed)
Sarah Tyler - 51 y.o. female MRN [id number]  Date of birth: 08-28-1965  SUBJECTIVE:  Including CC & ROS.  Chief Complaint  Patient presents with  . Left knee pain    Sarah Tyler is a 51 y.o. female that is presenting with left knee pain. Pain has been present with two weeks. She denies injury or previous surgeries. Pain is constant ache. Standing and ambulating trigger the pain. She has taken some motrin and soaking in Epsom salts with no improvement. Pain is medial joint line and medial patella in nature. No history of similar injury. Has pain worse with walking and going up or down stairs. No swelling in the knee. Pain is localized to the knee. Has pain that is moderate to severe in nature. Has felt like the knee will give way but no locking.     Review of Systems  Constitutional: Negative for fever.  HENT: Negative for sinus pressure.   Respiratory: Negative for shortness of breath.   Cardiovascular: Negative for chest pain.  Musculoskeletal: Positive for gait problem. Negative for joint swelling.  Skin: Negative for color change.  Neurological: Negative for weakness.  Hematological: Negative for adenopathy.  Psychiatric/Behavioral: Negative for agitation.    HISTORY: Past Medical, Surgical, Social, and Family History Reviewed & Updated per EMR.   Pertinent Historical Findings include:  Past Medical History:  Diagnosis Date  . Allergy   . Fibroids   . Irregular bleeding     Past Surgical History:  Procedure Laterality Date  . TUBAL LIGATION    . VAGINAL HYSTERECTOMY N/A 01/05/2013   Procedure: TOTAL VAGINAL HYSTERECTOMY ;  Surgeon: Osborne Oman, MD;  Location: [facility];  Service: Gynecology;  Laterality: N/A;    No Known Allergies  Family History  Problem Relation Age of Onset  . Hypertension Mother   . Ovarian cancer Mother        cervical  . HIV/AIDS Father      Social History   Socioeconomic History  . Marital status: Single    Spouse name: Not on file    . Number of children: 2  . Years of education: 11  . Highest education level: Not on file  Social Needs  . Financial resource strain: Not on file  . Food insecurity - worry: Not on file  . Food insecurity - inability: Not on file  . Transportation needs - medical: Not on file  . Transportation needs - non-medical: Not on file  Occupational History  . Not on file  Tobacco Use  . Smoking status: Current Every Day Smoker    Packs/day: 0.25    Types: Cigarettes  . Smokeless tobacco: Never Used  Substance and Sexual Activity  . Alcohol use: Yes    Comment: socially, once every 2-3 months  . Drug use: No  . Sexual activity: Not Currently    Birth control/protection: Surgical  Other Topics Concern  . Not on file  Social History Narrative   Lives in a one story home.  Her 59 month old granddaughter lives with her.     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     Education: 1 year of college.      PHYSICAL EXAM:  VS: BP 134/68 (BP Location: Left Arm, Patient Position: Sitting, Cuff Size: Normal)   Pulse 85   Temp 98.1 F (36.7 C) (Oral)   Ht 5' (1.524 m)   LMP 12/26/2012   SpO2 100%   BMI 33.68 kg/m  Physical Exam Gen: NAD, alert, cooperative with exam, well-appearing ENT: normal lips, normal nasal mucosa,  Eye: normal EOM, normal conjunctiva and lids CV:  no edema, +2 pedal pulses   Resp: no accessory muscle use, non-labored,  Skin: no rashes, no areas of induration  Neuro: normal tone, normal sensation to touch Psych:  normal insight, alert and oriented MSK:  Left knee:  No obvious effusion  Normal flexion and extension  TTP of the medial joint line and medial aspect of the patella.  No pain with patellar grind or compression  No pain with McMurray's testing  Walking with a slight limp  No  J sign  Neurovascularly intact      ASSESSMENT & PLAN:   Acute pain of left knee Pain is likely patellofemoral in nature. No injury or inciting  event. No effusion so less likely for OA or degenerative meniscus  - mobic  - counseled on home exercises  - if no improvement can try imaging, [place], and/or PT

## 2017-03-15 DIAGNOSIS — M25562 Pain in left knee: Secondary | ICD-10-CM | POA: Insufficient documentation

## 2017-03-15 NOTE — Assessment & Plan Note (Signed)
Pain is likely patellofemoral in nature. No injury or inciting event. No effusion so less likely for OA or degenerative meniscus  - mobic  - counseled on home exercises  - if no improvement can try imaging, [place], and/or PT

## 2017-05-20 ENCOUNTER — Other Ambulatory Visit: Payer: BLUE CROSS/BLUE SHIELD

## 2017-05-20 ENCOUNTER — Ambulatory Visit: Payer: BLUE CROSS/BLUE SHIELD | Admitting: Nurse Practitioner

## 2017-05-20 ENCOUNTER — Other Ambulatory Visit (HOSPITAL_COMMUNITY)
Admission: RE | Admit: 2017-05-20 | Discharge: 2017-05-20 | Disposition: A | Discharge status: Home / Self Care | Payer: BLUE CROSS/BLUE SHIELD | Source: Ambulatory Visit | Attending: Nurse Practitioner | Admitting: Nurse Practitioner

## 2017-05-20 ENCOUNTER — Encounter: Payer: Self-pay | Admitting: Nurse Practitioner

## 2017-05-20 VITALS — BP 114/68 | HR 79 | Temp 97.8°F | Resp 16 | Ht 60.0 in | Wt 171.0 lb

## 2017-05-20 DIAGNOSIS — Z114 Encounter for screening for human immunodeficiency virus [HIV]: Secondary | ICD-10-CM

## 2017-05-20 DIAGNOSIS — E669 Obesity, unspecified: Secondary | ICD-10-CM | POA: Diagnosis not present

## 2017-05-20 DIAGNOSIS — Z0001 Encounter for general adult medical examination with abnormal findings: Secondary | ICD-10-CM | POA: Insufficient documentation

## 2017-05-20 DIAGNOSIS — Z1151 Encounter for screening for human papillomavirus (HPV): Secondary | ICD-10-CM | POA: Insufficient documentation

## 2017-05-20 DIAGNOSIS — D179 Benign lipomatous neoplasm, unspecified: Secondary | ICD-10-CM

## 2017-05-20 DIAGNOSIS — J4520 Mild intermittent asthma, uncomplicated: Secondary | ICD-10-CM

## 2017-05-20 DIAGNOSIS — Z1211 Encounter for screening for malignant neoplasm of colon: Secondary | ICD-10-CM

## 2017-05-20 DIAGNOSIS — M2550 Pain in unspecified joint: Secondary | ICD-10-CM | POA: Diagnosis not present

## 2017-05-20 DIAGNOSIS — Z13220 Encounter for screening for lipoid disorders: Secondary | ICD-10-CM

## 2017-05-20 DIAGNOSIS — Z1159 Encounter for screening for other viral diseases: Secondary | ICD-10-CM

## 2017-05-20 DIAGNOSIS — Z9189 Other specified personal risk factors, not elsewhere classified: Secondary | ICD-10-CM

## 2017-05-20 MED ORDER — MELOXICAM 15 MG PO TABS: 15.0000 mg | ORAL_TABLET | Freq: Every day | ORAL | 0 refills | Status: DC

## 2017-05-20 MED ORDER — ALBUTEROL SULFATE HFA 108 (90 BASE) MCG/ACT IN AERS: 2.0000 | INHALATION_SPRAY | RESPIRATORY_TRACT | 5 refills | Status: AC | PRN

## 2017-05-20 NOTE — Patient Instructions (Signed)
Please head downstairs for lab work.  Please work on your diet and exercise as we discussed. Remember half of your plate should be veggies, one-fourth carbs, one-fourth meat, and don't eat meat at every meal. Also, remember to stay away from sugary drinks. I'd like for you to start incorporating exercise into your daily schedule. Start at 10 minutes a day, working up to 30 minutes five times a week.   If your labs are okay, I will plan to see you back in 1 year for your annual physical, or sooner if you need me.  It was nice to meet you. Thanks for letting me take care of you today :)   Preventive Care 40-64 Years, Female Preventive care refers to lifestyle choices and visits with your health care provider that can promote health and wellness. What does preventive care include?  A yearly physical exam. This is also called an annual well check.  Dental exams once or twice a year.  Routine eye exams. Ask your health care provider how often you should have your eyes checked.  Personal lifestyle choices, including: ? Daily care of your teeth and gums. ? Regular physical activity. ? Eating a healthy diet. ? Avoiding tobacco and drug use. ? Limiting alcohol use. ? Practicing safe sex. ? Taking low-dose aspirin daily starting at age 60. ? Taking vitamin and mineral supplements as recommended by your health care provider. What happens during an annual well check? The services and screenings done by your health care provider during your annual well check will depend on your age, overall health, lifestyle risk factors, and family history of disease. Counseling Your health care provider may ask you questions about your:  Alcohol use.  Tobacco use.  Drug use.  Emotional well-being.  Home and relationship well-being.  Sexual activity.  Eating habits.  Work and work Statistician.  Method of birth control.  Menstrual cycle.  Pregnancy history.  Screening You may have the  following tests or measurements:  Height, weight, and BMI.  Blood pressure.  Lipid and cholesterol levels. These may be checked every 5 years, or more frequently if you are over 85 years old.  Skin check.  Lung cancer screening. You may have this screening every year starting at age 80 if you have a 30-pack-year history of smoking and currently smoke or have quit within the past 15 years.  Fecal occult blood test (FOBT) of the stool. You may have this test every year starting at age 77.  Flexible sigmoidoscopy or colonoscopy. You may have a sigmoidoscopy every 5 years or a colonoscopy every 10 years starting at age 24.  Hepatitis C blood test.  Hepatitis B blood test.  Sexually transmitted disease (STD) testing.  Diabetes screening. This is done by checking your blood sugar (glucose) after you have not eaten for a while (fasting). You may have this done every 1-3 years.  Mammogram. This may be done every 1-2 years. Talk to your health care provider about when you should start having regular mammograms. This may depend on whether you have a family history of breast cancer.  BRCA-related cancer screening. This may be done if you have a family history of breast, ovarian, tubal, or peritoneal cancers.  Pelvic exam and Pap test. This may be done every 3 years starting at age 35. Starting at age 28, this may be done every 5 years if you have a Pap test in combination with an HPV test.  Bone density scan. This is done to  screen for osteoporosis. You may have this scan if you are at high risk for osteoporosis.  Discuss your test results, treatment options, and if necessary, the need for more tests with your health care provider. Vaccines Your health care provider may recommend certain vaccines, such as:  Influenza vaccine. This is recommended every year.  Tetanus, diphtheria, and acellular pertussis (Tdap, Td) vaccine. You may need a Td booster every 10 years.  Varicella vaccine. You  may need this if you have not been vaccinated.  Zoster vaccine. You may need this after age 53.  Measles, mumps, and rubella (MMR) vaccine. You may need at least one dose of MMR if you were born in 1957 or later. You may also need a second dose.  Pneumococcal 13-valent conjugate (PCV13) vaccine. You may need this if you have certain conditions and were not previously vaccinated.  Pneumococcal polysaccharide (PPSV23) vaccine. You may need one or two doses if you smoke cigarettes or if you have certain conditions.  Meningococcal vaccine. You may need this if you have certain conditions.  Hepatitis A vaccine. You may need this if you have certain conditions or if you travel or work in places where you may be exposed to hepatitis A.  Hepatitis B vaccine. You may need this if you have certain conditions or if you travel or work in places where you may be exposed to hepatitis B.  Haemophilus influenzae type b (Hib) vaccine. You may need this if you have certain conditions.  Talk to your health care provider about which screenings and vaccines you need and how often you need them. This information is not intended to replace advice given to you by your health care provider. Make sure you discuss any questions you have with your health care provider. Document Released: 04/01/2015 Document Revised: 11/23/2015 Document Reviewed: 01/04/2015 Elsevier Interactive Patient Education  [employer].

## 2017-05-20 NOTE — Assessment & Plan Note (Signed)
Stable, continue singulair and albuterol - albuterol (PROVENTIL HFA;VENTOLIN HFA) 108 (90 Base) MCG/ACT inhaler; Inhale 2 puffs into the lungs every 4 (four) hours as needed for wheezing.  Dispense: 1 Inhaler; Refill: 5

## 2017-05-20 NOTE — Assessment & Plan Note (Signed)
-  USPSTF grade A and B recommendations reviewed with patient; age-appropriate recommendations, preventive care, screening tests, etc discussed and encouraged; healthy living encouraged; see AVS for patient education given to patient -Discussed importance of 150 minutes of physical activity weekly, eat 6 servings of fruit/vegetables daily and drink plenty of water and avoid sweet beverages.   -Reviewed Health Maintenance:  -mammogram due- she will call breast center to schedule and notify me if unable -Screening for HIV (human immunodeficiency virus)- HIV antibody; Future -Encounter for hepatitis C virus screening test for high risk patient- Hepatitis C antibody; Future -Colon cancer screening-she will check insurance for cologuard coverage and let me know- will refer to GI if not eligible for cologuard -Encounter for screening for human papillomavirus (HPV)- Cytology - PAP; Future  Obesity (BMI 30.0-34.9) - CBC with Differential/Platelet; Future - Comprehensive metabolic panel; Future - Hemoglobin A1c; Future - Lipid panel; Future - TSH; Future

## 2017-05-20 NOTE — Progress Notes (Signed)
Name: Sarah Tyler   MRN: [id number]    DOB: [birth year]   Date:05/20/2017       Progress Note  Subjective  Chief Complaint  Chief Complaint  Patient presents with  . Establish Care    having tingling in hands and problem with knee would like the meloxicam filled    HPI  Patient presents for annual CPE. Diet: eats out frequently, fast food, chips; gatorade, ginger ale Exercise: occasionally walks, no routine exercise  USPSTF grade A and B recommendations  Depression: no concerns Depression screen [facility] 2/9 05/20/2017 10/14/2012  Decreased Interest 0 0  Down, Depressed, Hopeless 0 0  PHQ - 2 Score 0 0   Hypertension: BP Readings from Last 3 Encounters:  05/20/17 114/68  03/14/17 134/68  09/03/16 108/68   Obesity: Wt Readings from Last 3 Encounters:  05/20/17 171 lb (77.6 kg)  09/03/16 172 lb 7 oz (78.2 kg)  08/28/16 172 lb (78 kg)   BMI Readings from Last 3 Encounters:  05/20/17 33.40 kg/m  03/14/17 33.68 kg/m  09/03/16 33.68 kg/m    Alcohol: no Tobacco use: current smoker, thinking of quitting HIV, hep C: will order screening today STD testing and prevention (chl/gon/syphilis): declines STD testing, not currently sexually active Intimate partner violence: denies- feels safe at home Sexual History/Pain during Intercourse: not currently sexually active Menstrual History/LMP/Abnormal Bleeding: no abnormal vaginal bleeding Incontinence Symptoms: denies Vaccinations: up to date  Advanced Care Planning: A voluntary discussion about advance care planning including the explanation and discussion of advance directives.  Discussed health care proxy and Living will, and the patient DOES NOT have a living will at present time. If patient does have living will, I have requested they bring this to the clinic to be scanned in to their chart.  Breast cancer: mammogram due Cervical cancer screening:s/p partial hysterectomy- reports removal of uterus and cervix but would still like  PAP today to test for abnormalities today  Lipids:  Lab Results  Component Value Date   CHOL 135 10/14/2012   Lab Results  Component Value Date   HDL 30.60 (L) 10/14/2012   Lab Results  Component Value Date   LDLCALC 74 10/14/2012   Lab Results  Component Value Date   TRIG 150.0 (H) 10/14/2012   Lab Results  Component Value Date   CHOLHDL 4 10/14/2012   No results found for: LDLDIRECT  Glucose:  Glucose, Bld  Date Value Ref Range Status  03/31/2013 86 70 - 99 mg/dL Final  10/14/2012 70 70 - 99 mg/dL Final  08/16/2010 80 70 - 99 mg/dL Final    Skin cancer: No concerning moles or lesions. Colorectal cancer: discussed screening today- she will call insurance to verify coverage for cologuard  Aspirin: taking 81 daily ECG: not indicated   Patient Active Problem List   Diagnosis Date Noted  . Acute pain of left knee 03/15/2017  . Family history of ovarian cancer 02/27/2016  . Colon cancer screening 02/27/2016  . Well adult exam 09/06/2015  . Asthma 09/06/2015  . Neck pain, bilateral 03/30/2013  . Paresthesia 03/30/2013  . S/P vaginal hysterectomy 01/05/2013  . Obesity (BMI 30-39.9) 10/14/2012  . Allergic rhinitis 10/14/2012  . Trochanteric bursitis of right hip 10/14/2012  . Tobacco user 11/09/2010    Past Surgical History:  Procedure Laterality Date  . TUBAL LIGATION    . VAGINAL HYSTERECTOMY N/A 01/05/2013   Procedure: TOTAL VAGINAL HYSTERECTOMY ;  Surgeon: Osborne Oman, MD;  Location: [facility];  Service:  Gynecology;  Laterality: N/A;    Family History  Problem Relation Age of Onset  . Hypertension Mother   . Ovarian cancer Mother        cervical  . HIV/AIDS Father     Social History   Socioeconomic History  . Marital status: Single    Spouse name: Not on file  . Number of children: 2  . Years of education: 70  . Highest education level: Not on file  Social Needs  . Financial resource strain: Not on file  . Food insecurity - worry: Not on  file  . Food insecurity - inability: Not on file  . Transportation needs - medical: Not on file  . Transportation needs - non-medical: Not on file  Occupational History  . Not on file  Tobacco Use  . Smoking status: Current Every Day Smoker    Packs/day: 0.25    Types: Cigarettes  . Smokeless tobacco: Never Used  Substance and Sexual Activity  . Alcohol use: Yes    Comment: socially, once every 2-3 months  . Drug use: No  . Sexual activity: Not Currently    Birth control/protection: Surgical  Other Topics Concern  . Not on file  Social History Narrative   Lives in a one story home.  Her 21 month old granddaughter lives with her.     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     Education: 1 year of college.      Current Outpatient Medications:  .  albuterol (PROVENTIL HFA;VENTOLIN HFA) 108 (90 Base) MCG/ACT inhaler, Inhale 2 puffs into the lungs every 4 (four) hours as needed for wheezing., Disp: 1 Inhaler, Rfl: 5 .  aspirin 81 MG tablet, Take 81 mg by mouth daily.  , Disp: , Rfl:  .  Cyanocobalamin (VITAMIN B-12 PO), Take by mouth., Disp: , Rfl:  .  Diclofenac Sodium (PENNSAID) 2 % SOLN, Place 1 application onto the skin 2 (two) times daily., Disp: 1 Bottle, Rfl: 3 .  fish oil-omega-3 fatty acids 1000 MG capsule, Take 2 g by mouth daily. , Disp: , Rfl:  .  meloxicam (MOBIC) 15 MG tablet, Take 1 tablet (15 mg total) by mouth daily., Disp: 30 tablet, Rfl: 0 .  methocarbamol (ROBAXIN) 500 MG tablet, Take 1 tablet (500 mg total) by mouth 3 (three) times daily., Disp: 45 tablet, Rfl: 0 .  montelukast (SINGULAIR) 10 MG tablet, Take 1 tablet (10 mg total) by mouth daily., Disp: 90 tablet, Rfl: 3 .  Multiple Vitamin (MULTIVITAMIN) tablet, Take 1 tablet by mouth daily., Disp: , Rfl:  .  naproxen (NAPROSYN) 500 MG tablet, Take 1 tablet (500 mg total) by mouth 2 (two) times daily as needed., Disp: 60 tablet, Rfl: 1  No Known Allergies   ROS  Constitutional: Negative  for fever or weight change.  Respiratory: Negative for cough and shortness of breath.   Cardiovascular: Negative for chest pain or palpitations.  Gastrointestinal: Negative for abdominal pain, no bowel changes.  Musculoskeletal: Negative for gait problem or joint swelling. positive for joint pain. Skin: Negative for rash.  Neurological: Negative for dizziness or headache.  No other specific complaints in a complete review of systems (except as listed in HPI above).  Joint pain-  C/o chronic pain to joints for some time now- about 6 months She hurts in her hand ,shoulder, knee. She has been taking mobic which seems to help. Visits to prior providers for joint pain reviewed today  Objective  Vitals:   05/20/17 1505  BP: 114/68  Pulse: 79  Resp: 16  Temp: 97.8 F (36.6 C)  TempSrc: Oral  SpO2: 98%  Weight: 171 lb (77.6 kg)  Height: 5' (1.524 m)    Body mass index is 33.4 kg/m.  Physical Exam Vital signs reviewed. Constitutional: Patient appears well-developed and well-nourished. No distress.  HENT: Head: Normocephalic and atraumatic. Ears: B TMs ok, no erythema or effusion; Nose: Nose normal. Mouth/Throat: Oropharynx is clear and moist. No oropharyngeal exudate.  Eyes: Conjunctivae and EOM are normal. Pupils are equal, round, and reactive to light. No scleral icterus.  Neck: Normal range of motion. Neck supple. No cervical adenopathy noted. No thyromegaly present. soft moveable mass noted to posterior neck. Cardiovascular: Normal rate, regular rhythm and normal heart sounds.  No murmur heard. No BLE edema. Distal pulses intact Pulmonary/Chest: Effort normal and breath sounds normal. No respiratory distress. Abdominal: Soft. Bowel sounds are normal, no distension. There is no tenderness. no masses Breast: no lumps or masses, no nipple discharge or rashes FEMALE GENITALIA:  External genitalia normal External urethra normal Vaginal vault normal without discharge or  lesions Bimanual exam normal without masses Musculoskeletal: Normal range of motion, no joint effusions. No gross deformities Neurological: She is alert and oriented to person, place, and time. No cranial nerve deficit. Coordination, balance, strength, speech and gait are normal.  Skin: Skin is warm and dry. No rash noted. No erythema.  Psychiatric: Patient has a normal mood and affect. behavior is normal. Judgment and thought content normal.   PHQ2/9: Depression screen [facility] 2/9 05/20/2017 10/14/2012  Decreased Interest 0 0  Down, Depressed, Hopeless 0 0  PHQ - 2 Score 0 0    Fall Risk: Fall Risk  05/20/2017 09/03/2016 10/14/2012  Falls in the past year? No No No    Assessment & Plan RTC in 1 year for CPE if labs are stable   Lipoma, unspecified site ?lipoma to posterior neck-she says the area has started to become painful and bothersome She did have US soft tissue head/neck of the mass on 06/28/16 which read probable lipoma Will refer to surgery for further E&M - Ambulatory referral to General Surgery  Arthralgia, unspecified joint Will determine additional follow up pending lab results - meloxicam (MOBIC) 15 MG tablet; Take 1 tablet (15 mg total) by mouth daily.  Dispense: 30 tablet; Refill: 0 - CBC with Differential/Platelet; Future - Comprehensive metabolic panel; Future - Sed Rate (ESR); Future

## 2017-05-23 ENCOUNTER — Other Ambulatory Visit (INDEPENDENT_AMBULATORY_CARE_PROVIDER_SITE_OTHER): Payer: BLUE CROSS/BLUE SHIELD

## 2017-05-23 DIAGNOSIS — Z0001 Encounter for general adult medical examination with abnormal findings: Secondary | ICD-10-CM

## 2017-05-23 DIAGNOSIS — Z13220 Encounter for screening for lipoid disorders: Secondary | ICD-10-CM

## 2017-05-23 DIAGNOSIS — M2550 Pain in unspecified joint: Secondary | ICD-10-CM

## 2017-05-23 DIAGNOSIS — Z114 Encounter for screening for human immunodeficiency virus [HIV]: Secondary | ICD-10-CM

## 2017-05-23 DIAGNOSIS — E669 Obesity, unspecified: Secondary | ICD-10-CM

## 2017-05-23 DIAGNOSIS — Z9189 Other specified personal risk factors, not elsewhere classified: Secondary | ICD-10-CM

## 2017-05-23 DIAGNOSIS — Z1159 Encounter for screening for other viral diseases: Secondary | ICD-10-CM

## 2017-05-23 LAB — COMPREHENSIVE METABOLIC PANEL
ALT: 22 U/L (ref 0–35)
AST: 17 U/L (ref 0–37)
Albumin: 3.8 g/dL (ref 3.5–5.2)
Alkaline Phosphatase: 65 U/L (ref 39–117)
BUN: 8 mg/dL (ref 6–23)
CO2: 29 mEq/L (ref 19–32)
CREATININE: 0.74 mg/dL (ref 0.40–1.20)
Calcium: 9.4 mg/dL (ref 8.4–10.5)
Chloride: 105 mEq/L (ref 96–112)
GFR: 105.12 mL/min (ref >60.00)
GLUCOSE: 76 mg/dL (ref 70–99)
POTASSIUM: 4.3 meq/L (ref 3.5–5.1)
SODIUM: 140 meq/L (ref 135–145)
TOTAL PROTEIN: 6.9 g/dL (ref 6.0–8.3)
Total Bilirubin: 0.2 mg/dL (ref 0.2–1.2)

## 2017-05-23 LAB — CBC WITH DIFFERENTIAL/PLATELET
BASOS ABS: 0.1 10*3/uL (ref 0.0–0.1)
BASOS PCT: 0.8 % (ref 0.0–3.0)
EOS ABS: 0.2 10*3/uL (ref 0.0–0.7)
Eosinophils Relative: 2.2 % (ref 0.0–5.0)
HCT: 38 % (ref 36.0–46.0)
HEMOGLOBIN: 12.5 g/dL (ref 12.0–15.0)
Lymphocytes Relative: 43 % (ref 12.0–46.0)
Lymphs Abs: 4.6 10*3/uL — ABNORMAL HIGH (ref 0.7–4.0)
MCHC: 32.9 g/dL (ref 30.0–36.0)
MCV: 82.4 fl (ref 78.0–100.0)
MONOS PCT: 5.7 % (ref 3.0–12.0)
Monocytes Absolute: 0.6 10*3/uL (ref 0.1–1.0)
NEUTROS ABS: 5.2 10*3/uL (ref 1.4–7.7)
Neutrophils Relative %: 48.3 % (ref 43.0–77.0)
PLATELETS: 316 10*3/uL (ref 150.0–400.0)
RBC: 4.61 Mil/uL (ref 3.87–5.11)
RDW: 13.4 % (ref 11.5–15.5)
WBC: 10.7 10*3/uL — AB (ref 4.0–10.5)

## 2017-05-23 LAB — LIPID PANEL
CHOL/HDL RATIO: 3
Cholesterol: 125 mg/dL (ref 0–200)
HDL: 37.3 mg/dL — ABNORMAL LOW (ref >39.00)
LDL CALC: 55 mg/dL (ref 0–99)
NonHDL: 87.35
Triglycerides: 160 mg/dL — ABNORMAL HIGH (ref 0.0–149.0)
VLDL: 32 mg/dL (ref 0.0–40.0)

## 2017-05-23 LAB — HEMOGLOBIN A1C: HEMOGLOBIN A1C: 5.9 % (ref 4.6–6.5)

## 2017-05-23 LAB — TSH: TSH: 1.2 u[IU]/mL (ref 0.35–4.50)

## 2017-05-23 LAB — SEDIMENTATION RATE: Sed Rate: 32 mm/hr — ABNORMAL HIGH (ref 0–30)

## 2017-05-24 LAB — CYTOLOGY - PAP
BACTERIAL VAGINITIS: NEGATIVE
CANDIDA VAGINITIS: NEGATIVE
DIAGNOSIS: NEGATIVE

## 2017-05-24 LAB — HEPATITIS C ANTIBODY
Hepatitis C Ab: NONREACTIVE
SIGNAL TO CUT-OFF: 0.03 (ref <1.00)

## 2017-05-24 LAB — HIV ANTIBODY (ROUTINE TESTING W REFLEX): HIV: NONREACTIVE

## 2017-05-29 ENCOUNTER — Other Ambulatory Visit: Payer: Self-pay | Admitting: Nurse Practitioner

## 2017-05-29 DIAGNOSIS — Z1231 Encounter for screening mammogram for malignant neoplasm of breast: Secondary | ICD-10-CM

## 2017-05-31 ENCOUNTER — Encounter: Payer: Self-pay | Admitting: Nurse Practitioner

## 2017-05-31 DIAGNOSIS — R7303 Prediabetes: Secondary | ICD-10-CM | POA: Insufficient documentation

## 2017-07-08 ENCOUNTER — Ambulatory Visit: Payer: BLUE CROSS/BLUE SHIELD

## 2017-07-16 ENCOUNTER — Ambulatory Visit
Admission: RE | Admit: 2017-07-16 | Discharge: 2017-07-16 | Disposition: A | Discharge status: Home / Self Care | Payer: BLUE CROSS/BLUE SHIELD | Source: Ambulatory Visit | Attending: Nurse Practitioner | Admitting: Nurse Practitioner

## 2017-07-16 DIAGNOSIS — Z1231 Encounter for screening mammogram for malignant neoplasm of breast: Secondary | ICD-10-CM

## 2017-07-30 ENCOUNTER — Other Ambulatory Visit: Payer: Self-pay | Admitting: Nurse Practitioner

## 2017-07-30 DIAGNOSIS — M2550 Pain in unspecified joint: Secondary | ICD-10-CM

## 2017-08-01 ENCOUNTER — Other Ambulatory Visit: Payer: Self-pay

## 2017-08-01 DIAGNOSIS — M2550 Pain in unspecified joint: Secondary | ICD-10-CM

## 2017-08-01 MED ORDER — MELOXICAM 15 MG PO TABS: 15.0000 mg | ORAL_TABLET | Freq: Every day | ORAL | 0 refills | Status: DC

## 2017-08-15 ENCOUNTER — Other Ambulatory Visit: Payer: Self-pay

## 2017-08-15 ENCOUNTER — Ambulatory Visit (AMBULATORY_SURGERY_CENTER): Payer: Self-pay | Admitting: *Deleted

## 2017-08-15 VITALS — Ht 60.0 in | Wt 166.0 lb

## 2017-08-15 DIAGNOSIS — Z1211 Encounter for screening for malignant neoplasm of colon: Secondary | ICD-10-CM

## 2017-08-15 MED ORDER — SUPREP BOWEL PREP KIT 17.5-3.13-1.6 GM/177ML PO SOLN: 1.0000 | Freq: Once | ORAL | 0 refills | Status: AC

## 2017-08-15 NOTE — Progress Notes (Signed)
Patient denies any allergies to egg or soy products. Patient denies complications with anesthesia/sedation.  Patient denies oxygen use at home and denies diet medications. Pamphlet given for colonoscopy.

## 2017-08-23 ENCOUNTER — Encounter: Payer: Self-pay | Admitting: Gastroenterology

## 2017-08-29 ENCOUNTER — Other Ambulatory Visit: Payer: Self-pay

## 2017-08-29 ENCOUNTER — Encounter: Payer: Self-pay | Admitting: Gastroenterology

## 2017-08-29 ENCOUNTER — Ambulatory Visit (AMBULATORY_SURGERY_CENTER): Payer: BLUE CROSS/BLUE SHIELD | Admitting: Gastroenterology

## 2017-08-29 VITALS — BP 111/72 | HR 75 | Temp 97.7°F | Resp 13 | Ht 60.0 in | Wt 166.0 lb

## 2017-08-29 DIAGNOSIS — Z1211 Encounter for screening for malignant neoplasm of colon: Secondary | ICD-10-CM | POA: Diagnosis present

## 2017-08-29 MED ORDER — SODIUM CHLORIDE 0.9 % IV SOLN: 500.0000 mL | Freq: Once | INTRAVENOUS | Status: AC

## 2017-08-29 NOTE — Op Note (Signed)
[place] Patient Name: Michaiah Maiden Procedure Date: 08/29/2017 10:38 AM MRN: [id number] Endoscopist: Jackquline Denmark , MD Age: 52 Referring MD:  Date of Birth: [birth year] Gender: Female Account #: [id number] Procedure:                Colonoscopy Indications:              Screening for colorectal malignant neoplasm Medicines:                Monitored Anesthesia Care Procedure:                Pre-Anesthesia Assessment:                           - Prior to the procedure, a History and Physical                            was performed, and patient medications and                            allergies were reviewed. The patient is competent.                            The risks and benefits of the procedure and the                            sedation options and risks were discussed with the                            patient. All questions were answered and informed                            consent was obtained. Patient identification and                            proposed procedure were verified by the physician                            in the procedure room. Mental Status Examination:                            alert and oriented. Prophylactic Antibiotics: The                            patient does not require prophylactic antibiotics.                            Prior Anticoagulants: The patient has taken no                            previous anticoagulant or antiplatelet agents. ASA                            Grade Assessment: II - A patient with mild systemic  disease. After reviewing the risks and benefits,                            the patient was deemed in satisfactory condition to                            undergo the procedure. The anesthesia plan was to                            use monitored anesthesia care (MAC). Immediately                            prior to administration of medications, the patient                            was  re-assessed for adequacy to receive sedatives.                            The heart rate, respiratory rate, oxygen                            saturations, blood pressure, adequacy of pulmonary                            ventilation, and response to care were monitored                            throughout the procedure. The physical status of                            the patient was re-assessed after the procedure.                           After obtaining informed consent, the colonoscope                            was passed under direct vision. Throughout the                            procedure, the patient's blood pressure, pulse, and                            oxygen saturations were monitored continuously. The                            Model PCF-H190DL [id number]) scope was introduced                            through the anus and advanced to the the terminal                            ileum, with identification of the appendiceal  orifice and IC valve. The colonoscopy was performed                            without difficulty. The patient tolerated the                            procedure well. The quality of the bowel                            preparation was good. Scope In: 10:47:22 AM Scope Out: 11:00:14 AM Scope Withdrawal Time: 0 hours 9 minutes 2 seconds  Total Procedure Duration: 0 hours 12 minutes 52 seconds  Findings:                 A few medium-mouthed diverticula were found in the                            sigmoid colon and descending colon. There was no                            evidence of diverticular bleeding.                           Non-bleeding internal hemorrhoids were found. The                            hemorrhoids were small.                           The exam was otherwise without abnormality on                            direct and retroflexion views. Complications:            No immediate complications. Estimated  Blood Loss:     Estimated blood loss: none. Impression:               - Mild left colonic diverticulosis.                           - Small internal hemorrhoids.                           - Otherwise normal colonoscopy to terminal ileum. Recommendation:           - Patient has a contact number available for                            emergencies. The signs and symptoms of potential                            delayed complications were discussed with the                            patient. Return to normal activities tomorrow.  Written discharge instructions were provided to the                            patient.                           - High fiber diet.                           - Continue present medications.                           - Continue present medications.                           - Repeat colonoscopy in 10 years for screening                            purposes. Earlier, if she starts having any new                            problems or if there is any change in family                            history.                           - Return to GI clinic PRN. Jackquline Denmark, MD 08/29/2017 11:05:21 AM This report has been signed electronically.

## 2017-08-29 NOTE — Progress Notes (Signed)
Report to PACU, RN, vss, BBS= Clear.  

## 2017-08-29 NOTE — Patient Instructions (Signed)
Impression/recommendations:  YOU HAD AN ENDOSCOPIC PROCEDURE TODAY AT [facility]:   Refer to the procedure report that was given to you for any specific questions about what was found during the examination.  If the procedure report does not answer your questions, please call your gastroenterologist to clarify.  If you requested that your care partner not be given the details of your procedure findings, then the procedure report has been included in a sealed envelope for you to review at your convenience later.  YOU SHOULD EXPECT: Some feelings of bloating in the abdomen. Passage of more gas than usual.  Walking can help get rid of the air that was put into your GI tract during the procedure and reduce the bloating. If you had a lower endoscopy (such as a colonoscopy or flexible sigmoidoscopy) you may notice spotting of blood in your stool or on the toilet paper. If you underwent a bowel prep for your procedure, you may not have a normal bowel movement for a few days.  Please Note:  You might notice some irritation and congestion in your nose or some drainage.  This is from the oxygen used during your procedure.  There is no need for concern and it should clear up in a day or so.  SYMPTOMS TO REPORT IMMEDIATELY:   Following lower endoscopy (colonoscopy or flexible sigmoidoscopy):  Excessive amounts of blood in the stool  Significant tenderness or worsening of abdominal pains  Swelling of the abdomen that is new, acute  Fever of 100F or higher  For urgent or emergent issues, a gastroenterologist can be reached at any hour by calling [phone].   DIET:  We do recommend a small meal at first, but then you may proceed to your regular diet.  Drink plenty of fluids but you should avoid alcoholic beverages for 24 hours.  ACTIVITY:  You should plan to take it easy for the rest of today and you should NOT DRIVE or use heavy machinery until tomorrow (because of the sedation  medicines used during the test).    FOLLOW UP: Our staff will call the number listed on your records the next business day following your procedure to check on you and address any questions or concerns that you may have regarding the information given to you following your procedure. If we do not reach you, we will leave a message.  However, if you are feeling well and you are not experiencing any problems, there is no need to return our call.  We will assume that you have returned to your regular daily activities without incident.  If any biopsies were taken you will be contacted by phone or by letter within the next 1-3 weeks.  Please call us at [phone] if you have not heard about the biopsies in 3 weeks.    SIGNATURES/CONFIDENTIALITY: You and/or your care partner have signed paperwork which will be entered into your electronic medical record.  These signatures attest to the fact that that the information above on your After Visit Summary has been reviewed and is understood.  Full responsibility of the confidentiality of this discharge information lies with you and/or your care-partner.

## 2017-08-30 ENCOUNTER — Telehealth: Payer: Self-pay

## 2017-08-30 NOTE — Telephone Encounter (Signed)
  Follow up Call-  Call back number 08/29/2017  Post procedure Call Back phone  # [phone]  Permission to leave phone message Yes  Some recent data might be hidden     Patient questions:  Do you have a fever, pain , or abdominal swelling? No. Pain Score  0 *  Have you tolerated food without any problems? Yes.    Have you been able to return to your normal activities? Yes.    Do you have any questions about your discharge instructions: Diet   No. Medications  No. Follow up visit  No.  Do you have questions or concerns about your Care? No.  Actions: * If pain score is 4 or above: No action needed, pain <4.

## 2017-09-19 ENCOUNTER — Encounter (HOSPITAL_COMMUNITY): Payer: Self-pay | Admitting: *Deleted

## 2017-09-19 ENCOUNTER — Ambulatory Visit (HOSPITAL_COMMUNITY)
Admission: EM | Admit: 2017-09-19 | Discharge: 2017-09-19 | Disposition: A | Discharge status: Home / Self Care | Payer: BLUE CROSS/BLUE SHIELD | Attending: Family Medicine | Admitting: Family Medicine

## 2017-09-19 DIAGNOSIS — L089 Local infection of the skin and subcutaneous tissue, unspecified: Secondary | ICD-10-CM | POA: Diagnosis not present

## 2017-09-19 MED ORDER — CEFTRIAXONE SODIUM 1 G IJ SOLR
INTRAMUSCULAR | Status: AC
  Filled 2017-09-19: qty 10

## 2017-09-19 MED ORDER — CEPHALEXIN 500 MG PO CAPS: 500.0000 mg | ORAL_CAPSULE | Freq: Three times a day (TID) | ORAL | 0 refills | Status: DC

## 2017-09-19 MED ORDER — CEFTRIAXONE SODIUM 1 G IJ SOLR
1.0000 g | Freq: Once | INTRAMUSCULAR | Status: AC
  Administered 2017-09-19: 1 g via INTRAMUSCULAR

## 2017-09-19 MED ORDER — LIDOCAINE HCL (PF) 1 % IJ SOLN
INTRAMUSCULAR | Status: AC
  Filled 2017-09-19: qty 2

## 2017-09-19 NOTE — Discharge Instructions (Signed)
Elevate to reduce swelling.  Take antibiotic as directed. Return promptly for any worsening of pain, or swelling.  Return if the redness increases or spreads of the finger.  Return if you develop any fever or chills.

## 2017-09-19 NOTE — ED Provider Notes (Signed)
[place]    CSN: [id number] Arrival date & time: 09/19/17  1313     History   Chief Complaint No chief complaint on file.   HPI Sarah Tyler is a 52 y.o. female.   HPI  Patient states that she started feeling some swelling in her right little finger last night.  No pain.  It felt slightly numb.  She got this morning it was more swollen, painful, and she cannot move her distal joint.  The pain is getting worse.  She is a very she had an infection around her artificial fingernail, took the nail off.  There is no wound.  No splinter.  No foreign body.  No insect bite.  She is had no fever or chills.  She is never had skin infections before.  Past Medical History:  Diagnosis Date  . Arthritis    shoulders, hands  . Asthma   . Diabetes mellitus without complication ([place])    pre-diabetic diet controlled  . Fibroids   . Irregular bleeding   . Seasonal allergies   . SVD (spontaneous vaginal delivery)    x 2    Patient Active Problem List   Diagnosis Date Noted  . Prediabetes 05/31/2017  . Acute pain of left knee 03/15/2017  . Family history of ovarian cancer 02/27/2016  . Colon cancer screening 02/27/2016  . Encounter for general adult medical examination with abnormal findings 09/06/2015  . Asthma 09/06/2015  . Neck pain, bilateral 03/30/2013  . Paresthesia 03/30/2013  . S/P vaginal hysterectomy 01/05/2013  . Obesity (BMI 30-39.9) 10/14/2012  . Allergic rhinitis 10/14/2012  . Trochanteric bursitis of right hip 10/14/2012  . Tobacco user 11/09/2010    Past Surgical History:  Procedure Laterality Date  . ABDOMINAL HYSTERECTOMY    . TONSILLECTOMY    . TONSILLECTOMY AND ADENOIDECTOMY    . TUBAL LIGATION    . VAGINAL HYSTERECTOMY N/A 01/05/2013   Procedure: TOTAL VAGINAL HYSTERECTOMY ;  Surgeon: Osborne Oman, MD;  Location: [facility];  Service: Gynecology;  Laterality: N/A;    OB History    Gravida  4   Para  2   Term  2   Preterm      AB  2   Living  2     SAB  1   TAB  1   Ectopic      Multiple      Live Births               Home Medications    Prior to Admission medications   Medication Sig Start Date End Date Taking? Authorizing Provider  aspirin 81 MG tablet Take 81 mg by mouth daily.     Yes [provider]  Cyanocobalamin (VITAMIN B-12 PO) Take by mouth.   Yes [provider]  fish oil-omega-3 fatty acids 1000 MG capsule Take 2 g by mouth daily.    Yes [provider]  meloxicam (MOBIC) 15 MG tablet Take 1 tablet (15 mg total) by mouth daily. 08/01/17  Yes Shambley, Delphia Grates, NP  montelukast (SINGULAIR) 10 MG tablet Take 1 tablet (10 mg total) by mouth daily. 08/28/16  Yes Plotnikov, Evie Lacks, MD  Multiple Vitamin (MULTIVITAMIN) tablet Take 1 tablet by mouth daily.   Yes [provider]  albuterol (PROVENTIL HFA;VENTOLIN HFA) 108 (90 Base) MCG/ACT inhaler Inhale 2 puffs into the lungs every 4 (four) hours as needed for wheezing. 05/20/17   Lance Sell, NP  Family History Family History  Problem Relation Age of Onset  . Hypertension Mother   . Ovarian cancer Mother        cervical  . HIV/AIDS Father   . Colon cancer Neg Hx   . Rectal cancer Neg Hx   . Stomach cancer Neg Hx     Social History Social History   Tobacco Use  . Smoking status: Current Every Day Smoker    Packs/day: 0.50    Years: 27.00    Pack years: 13.50    Types: Cigarettes  . Smokeless tobacco: Never Used  Substance Use Topics  . Alcohol use: Yes    Alcohol/week: 2.4 oz    Types: 4 Glasses of wine per week    Comment: wine on weekends  . Drug use: No     Allergies   Patient has no known allergies.   Review of Systems Review of Systems  Constitutional: Negative for chills and fever.  HENT: Negative for ear pain and sore throat.   Eyes: Negative for pain and visual disturbance.  Respiratory: Negative for cough and shortness of breath.   Cardiovascular: Negative for  chest pain and palpitations.  Gastrointestinal: Negative for abdominal pain and vomiting.  Genitourinary: Negative for dysuria and hematuria.  Musculoskeletal: Negative for arthralgias and back pain.  Skin: Positive for color change. Negative for rash.  Neurological: Negative for seizures and syncope.  All other systems reviewed and are negative.   Physical Exam Triage Vital Signs ED Triage Vitals  Enc Vitals Group     BP 09/19/17 1346 (!) 149/86     Pulse Rate 09/19/17 1346 79     Resp 09/19/17 1346 14     Temp 09/19/17 1346 98.3 F (36.8 C)     Temp src --      SpO2 09/19/17 1346 100 %     Weight --      Height --      Head Circumference --      Peak Flow --      Pain Score 09/19/17 1347 8     Pain Loc --      Pain Edu? --      Excl. in [place]? --    No data found.  Updated Vital Signs BP (!) 149/86   Pulse 79   Temp 98.3 F (36.8 C)   Resp 14   LMP 12/26/2012   SpO2 100%   Visual Acuity Right Eye Distance:   Left Eye Distance:   Bilateral Distance:    Right Eye Near:   Left Eye Near:    Bilateral Near:     Physical Exam  Constitutional: She appears well-developed and well-nourished. No distress.  HENT:  Head: Normocephalic and atraumatic.  Mouth/Throat: Oropharynx is clear and moist.  Eyes: Pupils are equal, round, and reactive to light. Conjunctivae are normal.  Neck: Normal range of motion.  Cardiovascular: Normal rate.  Pulmonary/Chest: Effort normal. No respiratory distress.  Abdominal: Soft. She exhibits no distension.  Musculoskeletal: Normal range of motion. She exhibits no edema.  Right hand is examined.  There is diminished motion at the DIP.  The tip of the finger, pad, has deep erythema and warmth.  Moderately swollen.  Tender to palpation.  No fluctuance identified.  No paronychia identified.  No wound on skin or open area.  Neurological: She is alert.  Skin: Skin is warm and dry.  Psychiatric: She has a normal mood and affect. Her behavior is  normal.  UC Treatments / Results  Labs (all labs ordered are listed, but only abnormal results are displayed) Labs Reviewed - No data to display  EKG None  Radiology No results found.  Procedures Procedures (including critical care time)  Medications Ordered in UC Medications - No data to display  Initial Impression / Assessment and Plan / UC Course  I have reviewed the triage vital signs and the nursing notes.  Pertinent labs & imaging results that were available during my care of the patient were reviewed by me and considered in my medical decision making (see chart for details).     This looks like a cellulitis.  She may have an early felon.  I do not feel anything that needs to be incised or drained.  I am going to give her antibiotics to start with.  She has strict instructions to return if worse.  I did discuss with her that if her finger continues to be painful and may need to be opened. Final Clinical Impressions(s) / UC Diagnoses   Final diagnoses:  None   Discharge Instructions   None    ED Prescriptions    None     Controlled Substance Prescriptions [place] Controlled Substance Registry consulted? Not Applicable   Raylene Everts, MD 09/19/17 [phone]

## 2017-09-19 NOTE — ED Triage Notes (Signed)
Reports noticing right little finger with altered sensation last night.  Today distal aspect has become, red, swollen with inability to bend finger.  Pt removed artificial nail this AM.

## 2017-10-29 ENCOUNTER — Other Ambulatory Visit: Payer: Self-pay | Admitting: Nurse Practitioner

## 2017-10-29 DIAGNOSIS — Z791 Long term (current) use of non-steroidal anti-inflammatories (NSAID): Secondary | ICD-10-CM

## 2017-10-29 DIAGNOSIS — M2550 Pain in unspecified joint: Secondary | ICD-10-CM

## 2017-11-04 ENCOUNTER — Other Ambulatory Visit: Payer: Self-pay | Admitting: *Deleted

## 2017-11-04 DIAGNOSIS — M2550 Pain in unspecified joint: Secondary | ICD-10-CM

## 2017-11-04 MED ORDER — MELOXICAM 15 MG PO TABS: 15.0000 mg | ORAL_TABLET | Freq: Every day | ORAL | 1 refills | Status: AC

## 2017-11-05 ENCOUNTER — Other Ambulatory Visit: Payer: Self-pay | Admitting: Internal Medicine

## 2017-11-07 NOTE — Telephone Encounter (Signed)
Pt informed of below. She will come to the lab one day next week.

## 2017-11-07 NOTE — Telephone Encounter (Signed)
I received a refill request for mobic, and when I review her medication list it appears this was recently refilled along with naproxen Please let the patient know that these medications should not be taken together as they are both NSAIDs Also, please let her know that prolonged use of NSAIDs cause an increased risk of serious cardiovascular thrombotic events, including myocardial infarction (MI) and stroke, which can be fatal. This risk may occur early in treatment and may increase with duration of use. She should only use the naproxen and mobic for the shortest time period needed to provide relief of symptoms. We should update her blood count and kidney function labwork as well, please have her stop by lab for blood draw

## 2017-11-12 ENCOUNTER — Other Ambulatory Visit (INDEPENDENT_AMBULATORY_CARE_PROVIDER_SITE_OTHER): Payer: BLUE CROSS/BLUE SHIELD

## 2017-11-12 DIAGNOSIS — M2550 Pain in unspecified joint: Secondary | ICD-10-CM

## 2017-11-12 DIAGNOSIS — Z791 Long term (current) use of non-steroidal anti-inflammatories (NSAID): Secondary | ICD-10-CM | POA: Diagnosis not present

## 2017-11-12 LAB — BASIC METABOLIC PANEL
BUN: 16 mg/dL (ref 6–23)
CO2: 30 mEq/L (ref 19–32)
CREATININE: 0.92 mg/dL (ref 0.40–1.20)
Calcium: 9.6 mg/dL (ref 8.4–10.5)
Chloride: 105 mEq/L (ref 96–112)
GFR: 81.62 mL/min (ref >60.00)
Glucose, Bld: 88 mg/dL (ref 70–99)
POTASSIUM: 4.2 meq/L (ref 3.5–5.1)
Sodium: 140 mEq/L (ref 135–145)

## 2017-11-12 LAB — CBC
HCT: 38.9 % (ref 36.0–46.0)
Hemoglobin: 12.7 g/dL (ref 12.0–15.0)
MCHC: 32.6 g/dL (ref 30.0–36.0)
MCV: 82.4 fl (ref 78.0–100.0)
Platelets: 314 10*3/uL (ref 150.0–400.0)
RBC: 4.72 Mil/uL (ref 3.87–5.11)
RDW: 13.8 % (ref 11.5–15.5)
WBC: 13.7 10*3/uL — AB (ref 4.0–10.5)

## 2017-11-14 ENCOUNTER — Other Ambulatory Visit: Payer: Self-pay | Admitting: Nurse Practitioner

## 2017-11-14 DIAGNOSIS — D72829 Elevated white blood cell count, unspecified: Secondary | ICD-10-CM

## 2017-11-14 NOTE — Progress Notes (Signed)
refe

## 2017-11-20 ENCOUNTER — Encounter: Payer: Self-pay | Admitting: Hematology

## 2017-11-20 ENCOUNTER — Telehealth: Payer: Self-pay | Admitting: Hematology

## 2017-11-20 NOTE — Telephone Encounter (Signed)
New referral received from Caesar Chestnut, NP for leukocytosis. Pt has been scheduled to see Dr. Irene Limbo on 9/26 at 1pm. Letter mailed to the pt.

## 2017-12-10 ENCOUNTER — Other Ambulatory Visit: Payer: Self-pay

## 2017-12-10 ENCOUNTER — Encounter (HOSPITAL_COMMUNITY): Payer: Self-pay

## 2017-12-10 ENCOUNTER — Ambulatory Visit (HOSPITAL_COMMUNITY)
Admission: EM | Admit: 2017-12-10 | Discharge: 2017-12-10 | Disposition: A | Discharge status: Home / Self Care | Payer: BLUE CROSS/BLUE SHIELD | Attending: Family Medicine | Admitting: Family Medicine

## 2017-12-10 DIAGNOSIS — M79641 Pain in right hand: Secondary | ICD-10-CM | POA: Diagnosis not present

## 2017-12-10 DIAGNOSIS — R21 Rash and other nonspecific skin eruption: Secondary | ICD-10-CM

## 2017-12-10 MED ORDER — MUPIROCIN CALCIUM 2 % EX CREA: 1.0000 "application " | TOPICAL_CREAM | Freq: Two times a day (BID) | CUTANEOUS | 0 refills | Status: AC

## 2017-12-10 MED ORDER — TRIAMCINOLONE ACETONIDE 0.1 % EX CREA: 1.0000 "application " | TOPICAL_CREAM | Freq: Two times a day (BID) | CUTANEOUS | 0 refills | Status: DC

## 2017-12-10 MED ORDER — MONTELUKAST SODIUM 10 MG PO TABS: 10.0000 mg | ORAL_TABLET | Freq: Every day | ORAL | 3 refills | Status: DC

## 2017-12-10 NOTE — ED Provider Notes (Signed)
[place]    CSN: [id number] Arrival date & time: 12/10/17  1441     History   Chief Complaint Chief Complaint  Patient presents with  . Hand Pain    HPI Sarah Tyler is a 52 y.o. female.   Patient is a 52 year old female that presents for multiple complaints.  She has a rash to right upper arm that has been present for about 1 week.  Rash has been constant and remain the same.  Rash has been itchy.  She denies any drainage, fever, chills, body aches, night sweats associated with the rash.  She denies any insect bites or contact with plants.  No one else has the rash.  She is also complaining of 2 days of right hand burning sensation.  Denies any pain to the hand, wrist or injury.  Symptoms are intermittent throughout the day.  Reports some hot and cold sensations in the hand.  Patient is a prediabetic.  She does have a history of arthritis in shoulders and hands.      Past Medical History:  Diagnosis Date  . Arthritis    shoulders, hands  . Asthma   . Diabetes mellitus without complication ([place])    pre-diabetic diet controlled  . Fibroids   . Irregular bleeding   . Seasonal allergies   . SVD (spontaneous vaginal delivery)    x 2    Patient Active Problem List   Diagnosis Date Noted  . Prediabetes 05/31/2017  . Acute pain of left knee 03/15/2017  . Family history of ovarian cancer 02/27/2016  . Colon cancer screening 02/27/2016  . Encounter for general adult medical examination with abnormal findings 09/06/2015  . Asthma 09/06/2015  . Neck pain, bilateral 03/30/2013  . Paresthesia 03/30/2013  . S/P vaginal hysterectomy 01/05/2013  . Obesity (BMI 30-39.9) 10/14/2012  . Allergic rhinitis 10/14/2012  . Trochanteric bursitis of right hip 10/14/2012  . Tobacco user 11/09/2010    Past Surgical History:  Procedure Laterality Date  . ABDOMINAL HYSTERECTOMY    . TONSILLECTOMY    . TONSILLECTOMY AND ADENOIDECTOMY    . TUBAL LIGATION    . VAGINAL  HYSTERECTOMY N/A 01/05/2013   Procedure: TOTAL VAGINAL HYSTERECTOMY ;  Surgeon: Osborne Oman, MD;  Location: [facility];  Service: Gynecology;  Laterality: N/A;    OB History    Gravida  4   Para  2   Term  2   Preterm      AB  2   Living  2     SAB  1   TAB  1   Ectopic      Multiple      Live Births               Home Medications    Prior to Admission medications   Medication Sig Start Date End Date Taking? Authorizing Provider  albuterol (PROVENTIL HFA;VENTOLIN HFA) 108 (90 Base) MCG/ACT inhaler Inhale 2 puffs into the lungs every 4 (four) hours as needed for wheezing. 05/20/17   Lance Sell, NP  aspirin 81 MG tablet Take 81 mg by mouth daily.      [provider]  cephALEXin (KEFLEX) 500 MG capsule Take 1 capsule (500 mg total) by mouth 3 (three) times daily. 09/19/17   Raylene Everts, MD  Cyanocobalamin (VITAMIN B-12 PO) Take by mouth.    [provider]  fish oil-omega-3 fatty acids 1000 MG capsule Take 2 g by mouth daily.  [provider]  meloxicam (MOBIC) 15 MG tablet Take 1 tablet (15 mg total) by mouth daily. 11/04/17   Lance Sell, NP  montelukast (SINGULAIR) 10 MG tablet Take 1 tablet (10 mg total) by mouth daily. 12/10/17   Loura Halt A, NP  Multiple Vitamin (MULTIVITAMIN) tablet Take 1 tablet by mouth daily.    [provider]  mupirocin cream (BACTROBAN) 2 % Apply 1 application topically 2 (two) times daily. 12/10/17   Loura Halt A, NP  naproxen (NAPROSYN) 500 MG tablet TAKE 1 TABLET BY MOUTH TWICE DAILY AS NEEDED 11/05/17   Lance Sell, NP  triamcinolone cream (KENALOG) 0.1 % Apply 1 application topically 2 (two) times daily. 12/10/17   Orvan July, NP    Family History Family History  Problem Relation Age of Onset  . Hypertension Mother   . Ovarian cancer Mother        cervical  . HIV/AIDS Father   . Colon cancer Neg Hx   . Rectal cancer Neg Hx   . Stomach cancer Neg Hx      Social History Social History   Tobacco Use  . Smoking status: Current Every Day Smoker    Packs/day: 0.50    Years: 27.00    Pack years: 13.50    Types: Cigarettes  . Smokeless tobacco: Never Used  Substance Use Topics  . Alcohol use: Yes    Alcohol/week: 4.0 standard drinks    Types: 4 Glasses of wine per week    Comment: wine on weekends  . Drug use: No     Allergies   Patient has no known allergies.   Review of Systems Review of Systems   Physical Exam Triage Vital Signs ED Triage Vitals  Enc Vitals Group     BP 12/10/17 1528 116/90     Pulse Rate 12/10/17 1528 74     Resp 12/10/17 1528 18     Temp 12/10/17 1528 98.1 F (36.7 C)     Temp Source 12/10/17 1528 Oral     SpO2 12/10/17 1528 100 %     Weight 12/10/17 1533 163 lb (73.9 kg)     Height --      Head Circumference --      Peak Flow --      Pain Score --      Pain Loc --      Pain Edu? --      Excl. in [place]? --    No data found.  Updated Vital Signs BP 116/90 (BP Location: Right Arm)   Pulse 74   Temp 98.1 F (36.7 C) (Oral)   Resp 18   Wt 163 lb (73.9 kg)   LMP 12/26/2012   SpO2 100%   BMI 31.83 kg/m   Visual Acuity Right Eye Distance:   Left Eye Distance:   Bilateral Distance:    Right Eye Near:   Left Eye Near:    Bilateral Near:     Physical Exam  Constitutional: She is oriented to person, place, and time. She appears well-developed and well-nourished.  Very pleasant. Non toxic or ill appearing.     HENT:  Head: Normocephalic and atraumatic.  Eyes: Conjunctivae are normal.  Neck: Normal range of motion.  Pulmonary/Chest: Effort normal.  Musculoskeletal: Normal range of motion.  Normal ROM of arm, wrist  and hand. Non tender to touch.  No swelling, bruising, deformities.  No rashes to forearm or hand.   Neurological: She is alert and  oriented to person, place, and time.  Skin: Skin is warm and dry.  Pustular rash to the right upper arm.   Psychiatric: She has a  normal mood and affect.  Nursing note and vitals reviewed.    UC Treatments / Results  Labs (all labs ordered are listed, but only abnormal results are displayed) Labs Reviewed - No data to display  EKG None  Radiology No results found.  Procedures Procedures (including critical care time)  Medications Ordered in UC Medications - No data to display  Initial Impression / Assessment and Plan / UC Course  I have reviewed the triage vital signs and the nursing notes.  Pertinent labs & imaging results that were available during my care of the patient were reviewed by me and considered in my medical decision making (see chart for details).     Burning in hand could be related to arthritis. Triamcinolone cream prescribed to help with symptoms.  Bactroban cream for rash to upper arm   Refilled Singulair  Follow-up with PCP as needed Final Clinical Impressions(s) / UC Diagnoses   Final diagnoses:  Hand pain, right  Rash and nonspecific skin eruption     Discharge Instructions     It was nice meeting you!!  I am prescribing you some triamcinolone cream for the burning in your hand Bactroban rash on your upper arm and refilling your Singulair.  Follow up as needed for continued or worsening symptoms     ED Prescriptions    Medication Sig Dispense Auth. Provider   montelukast (SINGULAIR) 10 MG tablet Take 1 tablet (10 mg total) by mouth daily. 90 tablet Fredric Slabach A, NP   mupirocin cream (BACTROBAN) 2 % Apply 1 application topically 2 (two) times daily. 15 g Neymar Dowe A, NP   triamcinolone cream (KENALOG) 0.1 % Apply 1 application topically 2 (two) times daily. 30 g Loura Halt A, NP     Controlled Substance Prescriptions [place] Controlled Substance Registry consulted? Not Applicable   Orvan July, NP 12/11/17 1344

## 2017-12-10 NOTE — ED Triage Notes (Signed)
Pt states she right hand has been burning. As the pt was wanting the burning stopped.

## 2017-12-10 NOTE — Discharge Instructions (Signed)
It was nice meeting you!!  I am prescribing you some triamcinolone cream for the burning in your hand Bactroban rash on your upper arm and refilling your Singulair.  Follow up as needed for continued or worsening symptoms

## 2017-12-11 NOTE — Progress Notes (Signed)
HEMATOLOGY/ONCOLOGY CONSULTATION NOTE  Date of Service: 12/12/2017  Patient Care Team: Lance Sell, NP as PCP - General (Nurse Practitioner)  CHIEF COMPLAINTS/PURPOSE OF CONSULTATION:  Leukocytosis    HISTORY OF PRESENTING ILLNESS:   Sarah Tyler is a wonderful 52 y.o. female who has been referred to [place] by Caesar Chestnut, NP for evaluation and management of Leukocytosis. The pt reports that she is doing well overall.   The pt reports that she hasn't been aware of her high white blood cell counts for more than the last 6 months. The pt notes that she doesn't feel any differently now as compared to the past 6 months to a year.  The pt notes that she felt a burning sensation in her right arm two days ago. She was recently prescribed Meloxicam and Naproxen. She denies neck problems or injuries in the past. She describes neck discomfort with radiating pain/burning down her right arm. She also notes that she developed some itching on her right arm. She notes that her neck discomfort is alleviated by rubbing her neck.   The pt also notes that she has noticed some small bumps in the skin of her armpits - likely hidradenitis suppuritiva, which has been slightly uncomfortable but made more tolerable by using witch hazel.   The pt notes that she has pre-diabetes. She notes that she smokes a half to a full pack of cigarettes each day. She notes that she had an unremarkable colonoscopy, and is up to date with her mammograms.   Most recent lab results (11/12/17) of CBC is as follows: all values are WNL except for WBC at 13.7k. Last CBC w/diff on 05/23/17 revealed WBC at 10.7k, Lymphs abs at 4.6k, and ANC at 5.2k.   On review of systems, pt reports armpit bumps in skin, neck pain radiating down right arm with burning sensation, hot flashes, and denies drenching night sweats, unexpected weight loss, fevers, chills, recent infections, dental issues, ear pain or discomfort, obvious signs of  infections, skin rashes, pain along the spine, noticing any new lumps, abdominal pains, lower abdominal pains, changes in urination, changes in bowel movements, leg swelling, vaginal discharge, and any other symptoms.   On PMHx the pt reports partial hysterectomy in October 2014 keeping cervix and ovaries. On Social Hx the pt reports occasional mild alcohol consumption, smokes 1/2 pack to full pack cigarettes each day. Works in a Engineer, [employer].  On Family Hx the pt reports mother with a gynecological cancer.    MEDICAL HISTORY:  Past Medical History:  Diagnosis Date  . Arthritis    shoulders, hands  . Asthma   . Diabetes mellitus without complication ([place])    pre-diabetic diet controlled  . Fibroids   . Irregular bleeding    Vaginal  . Seasonal allergies   . SVD (spontaneous vaginal delivery)    x 2    SURGICAL HISTORY: Past Surgical History:  Procedure Laterality Date  . ABDOMINAL HYSTERECTOMY    . TONSILLECTOMY    . TONSILLECTOMY AND ADENOIDECTOMY    . TUBAL LIGATION    . VAGINAL HYSTERECTOMY N/A 01/05/2013   Procedure: TOTAL VAGINAL HYSTERECTOMY ;  Surgeon: Osborne Oman, MD;  Location: [facility];  Service: Gynecology;  Laterality: N/A;    SOCIAL HISTORY: Social History   Socioeconomic History  . Marital status: Single    Spouse name: Not on file  . Number of children: 2  . Years of education: 8  . Highest education  level: Not on file  Occupational History  . Not on file  Social Needs  . Financial resource strain: Not on file  . Food insecurity:    Worry: Not on file    Inability: Not on file  . Transportation needs:    Medical: Not on file    Non-medical: Not on file  Tobacco Use  . Smoking status: Current Every Day Smoker    Packs/day: 1.00    Years: 27.00    Pack years: 27.00    Types: Cigarettes  . Smokeless tobacco: Never Used  Substance and Sexual Activity  . Alcohol use: Yes    Alcohol/week: 4.0 standard drinks    Types: 4  Glasses of wine per week    Comment: once monthly  . Drug use: No  . Sexual activity: Not Currently    Birth control/protection: Surgical    Comment: Hysterectomy  Lifestyle  . Physical activity:    Days per week: Not on file    Minutes per session: Not on file  . Stress: Not on file  Relationships  . Social connections:    Talks on phone: Not on file    Gets together: Not on file    Attends religious service: Not on file    Active member of club or organization: Not on file    Attends meetings of clubs or organizations: Not on file    Relationship status: Not on file  . Intimate partner violence:    Fear of current or ex partner: Not on file    Emotionally abused: Not on file    Physically abused: Not on file    Forced sexual activity: Not on file  Other Topics Concern  . Not on file  Social History Narrative   Lives in a one story home.  Her 73 month old granddaughter lives with her.     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     Education: 1 year of college.     FAMILY HISTORY: Family History  Problem Relation Age of Onset  . Hypertension Mother   . Ovarian cancer Mother        cervical  . HIV/AIDS Father   . Colon cancer Neg Hx   . Rectal cancer Neg Hx   . Stomach cancer Neg Hx     ALLERGIES:  has No Known Allergies.  MEDICATIONS:  Current Outpatient Medications  Medication Sig Dispense Refill  . albuterol (PROVENTIL HFA;VENTOLIN HFA) 108 (90 Base) MCG/ACT inhaler Inhale 2 puffs into the lungs every 4 (four) hours as needed for wheezing. 1 Inhaler 5  . aspirin 81 MG tablet Take 81 mg by mouth daily.      . cephALEXin (KEFLEX) 500 MG capsule Take 1 capsule (500 mg total) by mouth 3 (three) times daily. 20 capsule 0  . Cyanocobalamin (VITAMIN B-12 PO) Take by mouth.    . fish oil-omega-3 fatty acids 1000 MG capsule Take 2 g by mouth daily.     . meloxicam (MOBIC) 15 MG tablet Take 1 tablet (15 mg total) by mouth daily. 30 tablet 1  .  montelukast (SINGULAIR) 10 MG tablet Take 1 tablet (10 mg total) by mouth daily. 90 tablet 3  . Multiple Vitamin (MULTIVITAMIN) tablet Take 1 tablet by mouth daily.    . mupirocin cream (BACTROBAN) 2 % Apply 1 application topically 2 (two) times daily. 15 g 0  . naproxen (NAPROSYN) 500 MG tablet TAKE 1 TABLET BY MOUTH TWICE DAILY AS  NEEDED 60 tablet 1  . triamcinolone cream (KENALOG) 0.1 % Apply 1 application topically 2 (two) times daily. 30 g 0   Current Facility-Administered Medications  Medication Dose Route Frequency Provider Last Rate Last Dose  . 0.9 %  sodium chloride infusion  500 mL Intravenous Once Jackquline Denmark, MD        REVIEW OF SYSTEMS:    10 Point review of Systems was done is negative except as noted above.  PHYSICAL EXAMINATION:   . Vitals:   12/12/17 1310  BP: 119/81  Pulse: 74  Resp: 18  Temp: 97.8 F (36.6 C)  SpO2: 100%   Filed Weights   12/12/17 1310  Weight: 167 lb 9.6 oz (76 kg)   .Body mass index is 32.73 kg/m.  GENERAL:alert, in no acute distress and comfortable SKIN: no acute rashes, no significant lesions EYES: conjunctiva are pink and non-injected, sclera anicteric OROPHARYNX: MMM, no exudates, no oropharyngeal erythema or ulceration NECK: supple, no JVD LYMPH:  no palpable lymphadenopathy in the cervical, axillary or inguinal regions LUNGS: clear to auscultation b/l with normal respiratory effort HEART: regular rate & rhythm ABDOMEN:  normoactive bowel sounds , non tender, not distended. No palpable hepatosplenomegaly Extremity: no pedal edema PSYCH: alert & oriented x 3 with fluent speech NEURO: no focal motor/sensory deficits  LABORATORY DATA:  I have reviewed the data as listed  . CBC Latest Ref Rng & Units 12/12/2017 11/12/2017 05/23/2017  WBC 3.9 - 10.3 K/uL 11.9(H) 13.7(H) 10.7(H)  Hemoglobin 11.6 - 15.9 g/dL 13.0 12.7 12.5  Hematocrit 34.8 - 46.6 % 39.6 38.9 38.0  Platelets 145 - 400 K/uL 304 314.0 316.0  ANC 7k . CBC      Component Value Date/Time   WBC 11.9 (H) 12/12/2017 1414   RBC 4.82 12/12/2017 1414   HGB 13.0 12/12/2017 1414   HCT 39.6 12/12/2017 1414   PLT 304 12/12/2017 1414   MCV 82.2 12/12/2017 1414   MCH 26.9 12/12/2017 1414   MCHC 32.8 12/12/2017 1414   RDW 13.6 12/12/2017 1414   LYMPHSABS 4.1 (H) 12/12/2017 1414   MONOABS 0.6 12/12/2017 1414   EOSABS 0.1 12/12/2017 1414   BASOSABS 0.1 12/12/2017 1414    . CMP Latest Ref Rng & Units 12/12/2017 11/12/2017 05/23/2017  Glucose 70 - 99 mg/dL 86 88 76  BUN 6 - 20 mg/dL 12 16 8   Creatinine 0.44 - 1.00 mg/dL 0.78 0.92 0.74  Sodium 135 - 145 mmol/L 142 140 140  Potassium 3.5 - 5.1 mmol/L 4.3 4.2 4.3  Chloride 98 - 111 mmol/L 106 105 105  CO2 22 - 32 mmol/L 29 30 29   Calcium 8.9 - 10.3 mg/dL 9.5 9.6 9.4  Total Protein 6.5 - 8.1 g/dL 7.5 - 6.9  Total Bilirubin 0.3 - 1.2 mg/dL 0.3 - 0.2  Alkaline Phos 38 - 126 U/L 92 - 65  AST 15 - 41 U/L 18 - 17  ALT 0 - 44 U/L 18 - 22   Component     Latest Ref Rng & Units 12/12/2017  LDH     98 - 192 U/L 159  Sed Rate     0 - 22 mm/hr 20     RADIOGRAPHIC STUDIES: I have personally reviewed the radiological images as listed and agreed with the findings in the report. No results found.  ASSESSMENT & PLAN:  52 y.o. female with  1. Leukocytosis - non progressive, improved. WBC improved from13.7k to 11.9k. Borderline neutrophilia and lymphocytosis. Flow cytometry - no  clonal lymphoproliferative process.  Likely reactive leucocytosis from cigarette smoking , ?hidradenitis , obesity. Unlikely to be a clonal BM pathology/MPN PLAN  -Discussed patient's most recent labs from 11/12/17, mild leukocytosis with WBC at 13.7k, no anemia, no thrombocytopenia. -Discussed that the patient's leukocytosis has been stable since at least 01/06/13 CBC where WBC were at 11.0k -Recommend pt pursue work up with her PCP for possible pinched nerve in her neck causing neck pain with radiation to upper extremities. -Pt  smokes 1/2 pack to full pack of cigarettes each day  -Discussed that the most likely explanation for her mild leukocytosis is her smoking, suggesting a reactive process and not concerning for a primary bone marrow problem  -Counseled the pt towards complete smoking cessation and she notes that she is going to try to completely quit  -Discussed that her noted armpit bumps in the skin are concerning for hydradenitis and advised not using a razor, and keeping the area dry. Discussed that his can also cause a mild leukocytosis.  -Will order blood tests today and will see the pt back as needed  . Orders Placed This Encounter  Procedures  . CBC with Differential/Platelet    Standing Status:   Future    Number of Occurrences:   1    Standing Expiration Date:   01/16/2019  . CMP ([place] only)    Standing Status:   Future    Number of Occurrences:   1    Standing Expiration Date:   12/13/2018  . Lactate dehydrogenase    Standing Status:   Future    Number of Occurrences:   1    Standing Expiration Date:   12/13/2018  . Sedimentation rate    Standing Status:   Future    Number of Occurrences:   1    Standing Expiration Date:   12/13/2018  . BCR ABL1 FISH ([facility])    Standing Status:   Future    Number of Occurrences:   1    Standing Expiration Date:   12/13/2018  . Flow Cytometry    Borderline lymphocytosis ? Reactive from smoking vs clonal    Standing Status:   Future    Number of Occurrences:   1    Standing Expiration Date:   12/13/2018    Labs today RTC with Dr Irene Limbo as needed based on labs   All of the patients questions were answered with apparent satisfaction. The patient knows to call the clinic with any problems, questions or concerns.  The total time spent in the appt was 45 minutes and more than 50% was on counseling and direct patient cares.    Sullivan Lone MD MS AAHIVMS [facility] Hematology/Oncology Physician [facility]  (Office):        [phone] (Work cell):  [phone] (Fax):           [phone]  12/12/2017 2:05 PM  I, Baldwin Jamaica, am acting as a scribe for Dr. Irene Limbo  .I have reviewed the above documentation for accuracy and completeness, and I agree with the above. Brunetta Genera MD

## 2017-12-12 ENCOUNTER — Encounter: Payer: Self-pay | Admitting: Hematology

## 2017-12-12 ENCOUNTER — Inpatient Hospital Stay: Payer: BLUE CROSS/BLUE SHIELD | Attending: Hematology | Admitting: Hematology

## 2017-12-12 ENCOUNTER — Inpatient Hospital Stay: Payer: BLUE CROSS/BLUE SHIELD

## 2017-12-12 ENCOUNTER — Telehealth: Payer: Self-pay

## 2017-12-12 VITALS — BP 119/81 | HR 74 | Temp 97.8°F | Resp 18 | Ht 60.0 in | Wt 167.6 lb

## 2017-12-12 DIAGNOSIS — D72829 Elevated white blood cell count, unspecified: Secondary | ICD-10-CM | POA: Diagnosis not present

## 2017-12-12 DIAGNOSIS — E119 Type 2 diabetes mellitus without complications: Secondary | ICD-10-CM | POA: Diagnosis not present

## 2017-12-12 DIAGNOSIS — Z8049 Family history of malignant neoplasm of other genital organs: Secondary | ICD-10-CM | POA: Insufficient documentation

## 2017-12-12 DIAGNOSIS — F17210 Nicotine dependence, cigarettes, uncomplicated: Secondary | ICD-10-CM | POA: Insufficient documentation

## 2017-12-12 DIAGNOSIS — Z800 Family history of malignant neoplasm of digestive organs: Secondary | ICD-10-CM | POA: Diagnosis not present

## 2017-12-12 LAB — CBC WITH DIFFERENTIAL/PLATELET
BASOS ABS: 0.1 10*3/uL (ref 0.0–0.1)
BASOS PCT: 1 %
Eosinophils Absolute: 0.1 10*3/uL (ref 0.0–0.5)
Eosinophils Relative: 1 %
HEMATOCRIT: 39.6 % (ref 34.8–46.6)
HEMOGLOBIN: 13 g/dL (ref 11.6–15.9)
Lymphocytes Relative: 35 %
Lymphs Abs: 4.1 10*3/uL — ABNORMAL HIGH (ref 0.9–3.3)
MCH: 26.9 pg (ref 25.1–34.0)
MCHC: 32.8 g/dL (ref 31.5–36.0)
MCV: 82.2 fL (ref 79.5–101.0)
Monocytes Absolute: 0.6 10*3/uL (ref 0.1–0.9)
Monocytes Relative: 5 %
NEUTROS PCT: 58 %
Neutro Abs: 7 10*3/uL — ABNORMAL HIGH (ref 1.5–6.5)
Platelets: 304 10*3/uL (ref 145–400)
RBC: 4.82 MIL/uL (ref 3.70–5.45)
RDW: 13.6 % (ref 11.2–14.5)
WBC: 11.9 10*3/uL — ABNORMAL HIGH (ref 3.9–10.3)

## 2017-12-12 LAB — CMP (CANCER CENTER ONLY)
ALK PHOS: 92 U/L (ref 38–126)
ALT: 18 U/L (ref 0–44)
AST: 18 U/L (ref 15–41)
Albumin: 3.7 g/dL (ref 3.5–5.0)
Anion gap: 7 (ref 5–15)
BILIRUBIN TOTAL: 0.3 mg/dL (ref 0.3–1.2)
BUN: 12 mg/dL (ref 6–20)
CALCIUM: 9.5 mg/dL (ref 8.9–10.3)
CO2: 29 mmol/L (ref 22–32)
Chloride: 106 mmol/L (ref 98–111)
Creatinine: 0.78 mg/dL (ref 0.44–1.00)
GFR, Est AFR Am: >60 mL/min (ref >60)
GFR, Estimated: >60 mL/min (ref >60)
GLUCOSE: 86 mg/dL (ref 70–99)
Potassium: 4.3 mmol/L (ref 3.5–5.1)
Sodium: 142 mmol/L (ref 135–145)
TOTAL PROTEIN: 7.5 g/dL (ref 6.5–8.1)

## 2017-12-12 LAB — LACTATE DEHYDROGENASE: LDH: 159 U/L (ref 98–192)

## 2017-12-12 LAB — SEDIMENTATION RATE: Sed Rate: 20 mm/hr (ref 0–22)

## 2017-12-12 NOTE — Telephone Encounter (Signed)
Printed avs of upcoming appointment per 9/26 los (lab addon)

## 2017-12-17 LAB — FLOW CYTOMETRY

## 2017-12-25 ENCOUNTER — Telehealth: Payer: Self-pay | Admitting: Nurse Practitioner

## 2017-12-25 NOTE — Telephone Encounter (Signed)
Sorry for the misunderstanding, when I looked at your labs from hematology visit, it looks like your white count is still elevated but stable, if you have additional questions about these labs, please contact the hematology office back. In reviewing the note, it did say they wanted you to see me if you were having problems with a pinched nerve, if you are still having problems with this you can schedule a follow up visit here

## 2017-12-25 NOTE — Telephone Encounter (Signed)
Pt informed of below. OV scheduled for 12/26/17 @ 3:30.

## 2017-12-25 NOTE — Telephone Encounter (Signed)
Copied from [place]. Topic: Quick Communication - See Telephone Encounter >> Dec 25, 2017  1:17 PM Vernona Rieger wrote: CRM for notification. See Telephone encounter for: 12/25/17.  Patient said she had blood work on 9/26 with the hematologist at the cancer center. She was advised that the results would be sent to [facility] and she is requesting those.

## 2017-12-26 ENCOUNTER — Encounter: Payer: Self-pay | Admitting: Nurse Practitioner

## 2017-12-26 ENCOUNTER — Ambulatory Visit: Payer: BLUE CROSS/BLUE SHIELD | Admitting: Nurse Practitioner

## 2017-12-26 VITALS — BP 120/80 | HR 83 | Ht 60.0 in | Wt 165.0 lb

## 2017-12-26 DIAGNOSIS — L732 Hidradenitis suppurativa: Secondary | ICD-10-CM

## 2017-12-26 DIAGNOSIS — Z23 Encounter for immunization: Secondary | ICD-10-CM

## 2017-12-26 DIAGNOSIS — M542 Cervicalgia: Secondary | ICD-10-CM

## 2017-12-26 LAB — BCR ABL1 FISH (GENPATH)

## 2017-12-26 NOTE — Assessment & Plan Note (Signed)
We discussed long term risks of mobic, naproxen and she will continue prn only CBC, CMET up to date Per chart review this has been ongoing for some time now, she is not really wanting to pursue any further treatment today but will scheduled F/U with sports med for evaluation

## 2017-12-26 NOTE — Patient Instructions (Addendum)
Please schedule a follow up appointment for further evaluation of neck pain here with Dr Tamala Julian or Dr Raeford Razor, our sports medicine providers.  Hidradenitis Suppurativa Hidradenitis suppurativa is a long-term (chronic) skin disease that starts with blocked sweat glands or hair follicles. Bacteria may grow in these blocked openings of your skin. Hidradenitis suppurativa is like a severe form of acne that develops in areas of your body where acne would be unusual. It is most likely to affect the areas of your body where skin rubs against skin and becomes moist. This includes your:  Underarms.  Groin.  Genital areas.  Buttocks.  Upper thighs.  Breasts.  Hidradenitis suppurativa may start out with small pimples. The pimples can develop into deep sores that break open (rupture) and drain pus. Over time your skin may thicken and become scarred. Hidradenitis suppurativa cannot be passed from person to person. What are the causes? The exact cause of hidradenitis suppurativa is not known. This condition may be due to:  Female and female hormones. The condition is rare before and after puberty.  An overactive body defense system (immune system). Your immune system may overreact to the blocked hair follicles or sweat glands and cause swelling and pus-filled sores.  What increases the risk? You may have a higher risk of hidradenitis suppurativa if you:  Are a woman.  Are between ages 62 and 4.  Have a family history of hidradenitis suppurativa.  Have a personal history of acne.  Are overweight.  Smoke.  Take the drug lithium.  What are the signs or symptoms? The first signs of an outbreak are usually painful skin bumps that look like pimples. As the condition progresses:  Skin bumps may get bigger and grow deeper into the skin.  Bumps under the skin may rupture and drain smelly pus.  Skin may become itchy and infected.  Skin may thicken and scar.  Drainage may continue through  tunnels under the skin (fistulas).  Walking and moving your arms can become painful.  How is this diagnosed? Your health care provider may diagnose hidradenitis suppurativa based on your medical history and your signs and symptoms. A physical exam will also be done. You may need to see a health care provider who specializes in skin diseases (dermatologist). You may also have tests done to confirm the diagnosis. These can include:  Swabbing a sample of pus or drainage from your skin so it can be sent to the lab and tested for infection.  Blood tests to check for infection.  How is this treated? The same treatment will not work for everybody with hidradenitis suppurativa. Your treatment will depend on how severe your symptoms are. You may need to try several treatments to find what works best for you. Part of your treatment may include cleaning and bandaging (dressing) your wounds. You may also have to take medicines, such as the following:  Antibiotics.  Acne medicines.  Medicines to block or suppress the immune system.  A diabetes medicine (metformin) is sometimes used to treat this condition.  For women, birth control pills can sometimes help relieve symptoms.  You may need surgery if you have a severe case of hidradenitis suppurativa that does not respond to medicine. Surgery may involve:  Using a laser to clear the skin and remove hair follicles.  Opening and draining deep sores.  Removing the areas of skin that are diseased and scarred.  Follow these instructions at home:  Learn as much as you can about your disease,  and work closely with your health care providers.  Take medicines only as directed by your health care provider.  If you were prescribed an antibiotic medicine, finish it all even if you start to feel better.  If you are overweight, losing weight may be very helpful. Try to reach and maintain a healthy weight.  Do not use any tobacco products, including  cigarettes, chewing tobacco, or electronic cigarettes. If you need help quitting, ask your health care provider.  Do not shave the areas where you get hidradenitis suppurativa.  Do not wear deodorant.  Wear loose-fitting clothes.  Try not to overheat and get sweaty.  Take a daily bleach bath as directed by your health care provider. ? Fill your bathtub halfway with water. ? Pour in  cup of unscented household bleach. ? Soak for 5-10 minutes.  Cover sore areas with a warm, clean washcloth (compress) for 5-10 minutes. Contact a health care provider if:  You have a flare-up of hidradenitis suppurativa.  You have chills or a fever.  You are having trouble controlling your symptoms at home. This information is not intended to replace advice given to you by your health care provider. Make sure you discuss any questions you have with your health care provider. Document Released: 10/18/2003 Document Revised: 08/11/2015 Document Reviewed: 06/05/2013 Elsevier Interactive Patient Education  2018 [employer].

## 2017-12-26 NOTE — Progress Notes (Signed)
Sarah Tyler is a 52 y.o. female with the following history as recorded in EpicCare:  Patient Active Problem List   Diagnosis Date Noted  . Prediabetes 05/31/2017  . Acute pain of left knee 03/15/2017  . Family history of ovarian cancer 02/27/2016  . Colon cancer screening 02/27/2016  . Encounter for general adult medical examination with abnormal findings 09/06/2015  . Asthma 09/06/2015  . Neck pain, bilateral 03/30/2013  . Paresthesia 03/30/2013  . S/P vaginal hysterectomy 01/05/2013  . Obesity (BMI 30-39.9) 10/14/2012  . Allergic rhinitis 10/14/2012  . Trochanteric bursitis of right hip 10/14/2012  . Tobacco user 11/09/2010    Current Outpatient Medications  Medication Sig Dispense Refill  . albuterol (PROVENTIL HFA;VENTOLIN HFA) 108 (90 Base) MCG/ACT inhaler Inhale 2 puffs into the lungs every 4 (four) hours as needed for wheezing. 1 Inhaler 5  . aspirin 81 MG tablet Take 81 mg by mouth daily.      . cephALEXin (KEFLEX) 500 MG capsule Take 1 capsule (500 mg total) by mouth 3 (three) times daily. 20 capsule 0  . Cyanocobalamin (VITAMIN B-12 PO) Take by mouth.    . fish oil-omega-3 fatty acids 1000 MG capsule Take 2 g by mouth daily.     . meloxicam (MOBIC) 15 MG tablet Take 1 tablet (15 mg total) by mouth daily. 30 tablet 1  . montelukast (SINGULAIR) 10 MG tablet Take 1 tablet (10 mg total) by mouth daily. 90 tablet 3  . Multiple Vitamin (MULTIVITAMIN) tablet Take 1 tablet by mouth daily.    . mupirocin cream (BACTROBAN) 2 % Apply 1 application topically 2 (two) times daily. 15 g 0  . naproxen (NAPROSYN) 500 MG tablet TAKE 1 TABLET BY MOUTH TWICE DAILY AS NEEDED 60 tablet 1  . triamcinolone cream (KENALOG) 0.1 % Apply 1 application topically 2 (two) times daily. 30 g 0   Current Facility-Administered Medications  Medication Dose Route Frequency Provider Last Rate Last Dose  . 0.9 %  sodium chloride infusion  500 mL Intravenous Once Jackquline Denmark, MD        Allergies: Patient  has no known allergies.  Past Medical History:  Diagnosis Date  . Arthritis    shoulders, hands  . Asthma   . Diabetes mellitus without complication ([place])    pre-diabetic diet controlled  . Fibroids   . Irregular bleeding    Vaginal  . Seasonal allergies   . SVD (spontaneous vaginal delivery)    x 2    Past Surgical History:  Procedure Laterality Date  . ABDOMINAL HYSTERECTOMY    . TONSILLECTOMY    . TONSILLECTOMY AND ADENOIDECTOMY    . TUBAL LIGATION    . VAGINAL HYSTERECTOMY N/A 01/05/2013   Procedure: TOTAL VAGINAL HYSTERECTOMY ;  Surgeon: Osborne Oman, MD;  Location: [facility];  Service: Gynecology;  Laterality: N/A;    Family History  Problem Relation Age of Onset  . Hypertension Mother   . Ovarian cancer Mother        cervical  . HIV/AIDS Father   . Colon cancer Neg Hx   . Rectal cancer Neg Hx   . Stomach cancer Neg Hx     Social History   Tobacco Use  . Smoking status: Current Every Day Smoker    Packs/day: 1.00    Years: 27.00    Pack years: 27.00    Types: Cigarettes  . Smokeless tobacco: Never Used  Substance Use Topics  . Alcohol use: Yes  Alcohol/week: 4.0 standard drinks    Types: 4 Glasses of wine per week    Comment: once monthly     Subjective:  Sarah Tyler is here today requesting evaluation of neck pain and skin problem, I sent her to oncology for evaluation of leukocytosis and she says she was told to follow up with me regarding her neck pain and hidradenitis.  Neck pain- This is not a new problem, she has been experiencing neck pain for many years now. Describes as aching pain in her posterior neck radiating into bilateral shoulders, arms. She has been evaluated for this pain in the past, was sent to PT at one point which she found helpful Since PT, she notices the pain worse if she does not do regular exercise. She does take naproxen or mobic occasionally as needed when the pain is worse with relief.  Skin problem- Describes as "hair  bumps" under her arms, present for years, worse when shaving but she has stopped shaving and started using nair, witch hazil recently and her "bumps" have seemed to improve. She denies fevers, pain, drainage, erythema.   ROS- See HPI  Objective:  Vitals:   12/26/17 1524  BP: 120/80  Pulse: 83  SpO2: 99%  Weight: 165 lb (74.8 kg)  Height: 5' (1.524 m)    General: Well developed, well nourished, in no acute distress  Skin : Warm and dry. Inflamed nodules, scarring to bilateral axillae Head: Normocephalic and atraumatic  Eyes: Sclera and conjunctiva clear; pupils round and reactive to light; extraocular movements intact  Oropharynx: Pink, supple. Neck: Supple without thyromegaly, adenopathy  Lungs: Respirations unlabored; clear to auscultation bilaterally without wheeze, rales, rhonchi  CVS exam: normal rate, regular rhythm, normal S1, S2, no murmurs. Musculoskeletal: No deformities; no active joint inflammation  Extremities: No edema, cyanosis Vessels: Symmetric bilaterally  Neurologic: Alert and oriented; speech intact; face symmetrical; moves all extremities well; CNII-XII intact without focal deficit   Assessment:  1. Need for influenza vaccination   2. Neck pain, bilateral   3. Hidradenitis     Plan:   No follow-ups on file.  Orders Placed This Encounter  Procedures  . Flu Vaccine QUAD 36+ mos IM    Requested Prescriptions    No prescriptions requested or ordered in this encounter    Reviewed HM: Need for influenza vaccination- Flu Vaccine QUAD 36+ mos IM

## 2017-12-26 NOTE — Assessment & Plan Note (Signed)
Discussed options to treat including weekly antibacterial washes, referral to dermatology We also discussed use of antibiotics if she experiences a flare Home management, red flags and return precautions including when to seek immediate care discussed and printed on AVS She declines any treatment at this time but will continue to monitor at home and follow up for new, worsening symptoms

## 2017-12-27 ENCOUNTER — Telehealth: Payer: Self-pay | Admitting: *Deleted

## 2017-12-27 ENCOUNTER — Telehealth: Payer: Self-pay | Admitting: Hematology

## 2017-12-27 NOTE — Telephone Encounter (Signed)
Called and discussed patients lab results. Improving WBC count. Likely reactive leucocytosis related to smoking and hidradenitis. No addition w/u recommended from hematologic standpoint at this time. F/u with PCP.  [place]land KitchenDrowning Creek

## 2017-12-27 NOTE — Telephone Encounter (Signed)
Patient left VM requesting Dr. Irene Limbo call her with lab results from 9/26. Information and request given to Dr. Irene Limbo.

## 2018-01-12 NOTE — Progress Notes (Signed)
[facility] [place], [place] [address] Phone: [phone] Subjective:    I Sarah Tyler am serving as a Education administrator for Dr. Hulan Saas.   I'm seeing this patient by the request  of:  Lance Sell, NP   CC: Neck pain   WEX:[id number]  Sarah Tyler is a 52 y.o. female coming in with complaint of right sided shoulder/ neck pain. Pain and numbness radiates to the finger tips. States that she also gets bumps down her arm that irritates her. Tight upper traps.   Onset- Chronic Location- Acromion  Duration-worsening over the course of numerous weeks Character-aching burning sensation Aggravating factors- Reliving factors- hot water Therapies tried-  Severity-7 out of 10      Past Medical History:  Diagnosis Date  . Arthritis    shoulders, hands  . Asthma   . Diabetes mellitus without complication ([place])    pre-diabetic diet controlled  . Fibroids   . Irregular bleeding    Vaginal  . Seasonal allergies   . SVD (spontaneous vaginal delivery)    x 2   Past Surgical History:  Procedure Laterality Date  . ABDOMINAL HYSTERECTOMY    . TONSILLECTOMY    . TONSILLECTOMY AND ADENOIDECTOMY    . TUBAL LIGATION    . VAGINAL HYSTERECTOMY N/A 01/05/2013   Procedure: TOTAL VAGINAL HYSTERECTOMY ;  Surgeon: Osborne Oman, MD;  Location: [facility];  Service: Gynecology;  Laterality: N/A;   Social History   Socioeconomic History  . Marital status: Single    Spouse name: Not on file  . Number of children: 2  . Years of education: 66  . Highest education level: Not on file  Occupational History  . Not on file  Social Needs  . Financial resource strain: Not on file  . Food insecurity:    Worry: Not on file    Inability: Not on file  . Transportation needs:    Medical: Not on file    Non-medical: Not on file  Tobacco Use  . Smoking status: Current Every Day Smoker    Packs/day: 1.00    Years: 27.00    Pack years: 27.00   Types: Cigarettes  . Smokeless tobacco: Never Used  Substance and Sexual Activity  . Alcohol use: Yes    Alcohol/week: 4.0 standard drinks    Types: 4 Glasses of wine per week    Comment: once monthly  . Drug use: No  . Sexual activity: Not Currently    Birth control/protection: Surgical    Comment: Hysterectomy  Lifestyle  . Physical activity:    Days per week: Not on file    Minutes per session: Not on file  . Stress: Not on file  Relationships  . Social connections:    Talks on phone: Not on file    Gets together: Not on file    Attends religious service: Not on file    Active member of club or organization: Not on file    Attends meetings of clubs or organizations: Not on file    Relationship status: Not on file  Other Topics Concern  . Not on file  Social History Narrative   Lives in a one story home.  Her 22 month old granddaughter lives with her.     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     Education: 1 year of college.    No Known Allergies Family History  Problem Relation Age  of Onset  . Hypertension Mother   . Ovarian cancer Mother        cervical  . HIV/AIDS Father   . Colon cancer Neg Hx   . Rectal cancer Neg Hx   . Stomach cancer Neg Hx     Current Outpatient Medications (Endocrine & Metabolic):  .  predniSONE (DELTASONE) 50 MG tablet, Take 1 tablet (50 mg total) by mouth daily with breakfast.     Current Outpatient Medications (Respiratory):  .  albuterol (PROVENTIL HFA;VENTOLIN HFA) 108 (90 Base) MCG/ACT inhaler, Inhale 2 puffs into the lungs every 4 (four) hours as needed for wheezing. .  montelukast (SINGULAIR) 10 MG tablet, Take 1 tablet (10 mg total) by mouth daily.   Current Outpatient Medications (Analgesics):  .  aspirin 81 MG tablet, Take 81 mg by mouth daily.   .  meloxicam (MOBIC) 15 MG tablet, Take 1 tablet (15 mg total) by mouth daily. .  naproxen (NAPROSYN) 500 MG tablet, TAKE 1 TABLET BY MOUTH TWICE DAILY AS  NEEDED   Current Outpatient Medications (Hematological):  Marland Kitchen  Cyanocobalamin (VITAMIN B-12 PO), Take by mouth.   Current Outpatient Medications (Other):  .  cephALEXin (KEFLEX) 500 MG capsule, Take 1 capsule (500 mg total) by mouth 3 (three) times daily. .  fish oil-omega-3 fatty acids 1000 MG capsule, Take 2 g by mouth daily.  .  Multiple Vitamin (MULTIVITAMIN) tablet, Take 1 tablet by mouth daily. .  mupirocin cream (BACTROBAN) 2 %, Apply 1 application topically 2 (two) times daily. Marland Kitchen  triamcinolone cream (KENALOG) 0.1 %, Apply 1 application topically 2 (two) times daily. Marland Kitchen  gabapentin (NEURONTIN) 100 MG capsule, Take 2 capsules (200 mg total) by mouth at bedtime.  Current Facility-Administered Medications (Other):  .  0.9 %  sodium chloride infusion    Past medical history, social, surgical and family history all reviewed in electronic medical record.  No pertanent information unless stated regarding to the chief complaint.   Review of Systems:  No headache, visual changes, nausea, vomiting, diarrhea, constipation, dizziness, abdominal pain, skin rash, fevers, chills, night sweats, weight loss, swollen lymph nodes, body aches, joint swelling,  chest pain, shortness of breath, mood changes.  Positive muscle aches  Objective  Blood pressure (!) 150/90, pulse 88, height 5' (1.524 m), weight 170 lb (77.1 kg), last menstrual period 12/26/2012, SpO2 98 %.    General: No apparent distress alert and oriented x3 mood and affect normal, dressed appropriately.  HEENT: Pupils equal, extraocular movements intact  Respiratory: Patient's speak in full sentences and does not appear short of breath  Cardiovascular: No lower extremity edema, non tender, no erythema  Skin: Warm dry intact with no signs of infection or rash on extremities or on axial skeleton.  Abdomen: Soft nontender  Neuro: Cranial nerves II through XII are intact, neurovascularly intact in all extremities with 2+ DTRs and 2+  pulses.  Lymph: No lymphadenopathy of posterior or anterior cervical chain or axillae bilaterally.  Gait normal with good balance and coordination.  MSK:  Non tender with full range of motion and good stability and symmetric strength and tone of shoulders, elbows, wrist, hip, knee and ankles bilaterally.  Neck: Inspection unremarkable. No palpable stepoffs. Positive Spurling's maneuver. Limited extension lacking last 10 degrees of extension lacks 5 degrees of right-sided rotation and sidebending With weakness noted in the C8 distribution Negative Hoffman sign bilaterally Reflexes normal  Tightness noted in the trapezius bilaterally   Impression and Recommendations:  This case required medical decision making of moderate complexity. The above documentation has been reviewed and is accurate and complete Lyndal Pulley, DO       Note: This dictation was prepared with Dragon dictation along with smaller phrase technology. Any transcriptional errors that result from this process are unintentional.

## 2018-01-13 ENCOUNTER — Ambulatory Visit: Payer: BLUE CROSS/BLUE SHIELD | Admitting: Family Medicine

## 2018-01-13 ENCOUNTER — Encounter: Payer: Self-pay | Admitting: Family Medicine

## 2018-01-13 DIAGNOSIS — M5412 Radiculopathy, cervical region: Secondary | ICD-10-CM | POA: Diagnosis not present

## 2018-01-13 MED ORDER — PREDNISONE 50 MG PO TABS: 50.0000 mg | ORAL_TABLET | Freq: Every day | ORAL | 0 refills | Status: AC

## 2018-01-13 MED ORDER — GABAPENTIN 100 MG PO CAPS: 200.0000 mg | ORAL_CAPSULE | Freq: Every day | ORAL | 3 refills | Status: AC

## 2018-01-13 NOTE — Assessment & Plan Note (Signed)
Right cervical radiculopathy.  Discussed icing regimen discussed prednisone and gabapentin.  Home exercise given.  We discussed potentially repeating imaging.  Patient did have imaging 4 years ago that was fairly unremarkable including an x-ray and an MRI.  Follow-up again in 2 weeks

## 2018-01-13 NOTE — Patient Instructions (Addendum)
Good to see you  Ice is your friend Ice 20 minutes 2 times daily. Usually after activity and before bed. Exercises 3 times a week.  Prednisone  Daily for 5 days  Gabapentin 200mg  at night Keep hands within peripheral vision  See me again in 4 weeks

## 2018-02-06 ENCOUNTER — Ambulatory Visit: Payer: BLUE CROSS/BLUE SHIELD | Admitting: Family Medicine

## 2018-03-17 ENCOUNTER — Telehealth: Payer: Self-pay | Admitting: Nurse Practitioner

## 2018-03-17 MED ORDER — TRIAMCINOLONE ACETONIDE 0.1 % EX CREA: 1.0000 "application " | TOPICAL_CREAM | Freq: Two times a day (BID) | CUTANEOUS | 0 refills | Status: DC

## 2018-03-17 NOTE — Telephone Encounter (Signed)
Rx printed, signed, faxed to pharmacy. See meds. Pt informed of below.

## 2018-03-17 NOTE — Telephone Encounter (Signed)
Patient came into to the office stating that she was seen at [facility] back on 12/10/2017 by Vanessa Kick, MD for itching and small bumps on her upper arm. She was prescribed Triamcinolone Acetonide 0.1% Cream which has helped tramendiously. She would like to know if a prescription of this can be sent to [facility] on [address].  Please advise.   If an appointment is needed, I can call her to schedule. She said that [facility] is aware of this issue.

## 2018-04-15 ENCOUNTER — Other Ambulatory Visit: Payer: Self-pay | Admitting: Nurse Practitioner

## 2018-04-15 MED ORDER — TRIAMCINOLONE ACETONIDE 0.1 % EX CREA: 1.0000 "application " | TOPICAL_CREAM | Freq: Two times a day (BID) | CUTANEOUS | 0 refills | Status: AC

## 2018-04-15 NOTE — Telephone Encounter (Signed)
Copied from [place] [id number]. Topic: Quick Communication - Rx Refill/Question >> Apr 15, 2018  2:14 PM Carolyn Stare wrote: Medication  triamcinolone cream (KENALOG) 0.1 %   Has the patient contacted their pharmacy yes   (Preferred Pharmacy   [facility] [address]   Agent: Please be advised that RX refills may take up to 3 business days. We ask that you follow-up with your pharmacy.

## 2018-04-15 NOTE — Telephone Encounter (Signed)
Copied from [place] [id number]. Topic: Quick Communication - Rx Refill/Question >> Apr 15, 2018  2:12 PM Carolyn Stare wrote: Medication      triamcinolone cream (KENALOG) 0.1 %     Has the patient contacted their pharmacy  yes   ( Preferred Pharmacy  [facility] [address]   Agent: Please be advised that RX refills may take up to 3 business days. We ask that you follow-up with your pharmacy.

## 2018-06-04 ENCOUNTER — Encounter: Payer: Self-pay | Admitting: Nurse Practitioner

## 2018-06-05 ENCOUNTER — Other Ambulatory Visit: Payer: Self-pay | Admitting: Family Medicine

## 2018-06-05 ENCOUNTER — Telehealth: Payer: Self-pay | Admitting: Nurse Practitioner

## 2018-06-05 DIAGNOSIS — Z1231 Encounter for screening mammogram for malignant neoplasm of breast: Secondary | ICD-10-CM

## 2018-06-05 MED ORDER — MONTELUKAST SODIUM 10 MG PO TABS: 10.0000 mg | ORAL_TABLET | Freq: Every day | ORAL | 0 refills | Status: AC

## 2018-06-05 MED ORDER — MONTELUKAST SODIUM 10 MG PO TABS: 10.0000 mg | ORAL_TABLET | Freq: Every day | ORAL | 0 refills | Status: DC

## 2018-06-05 NOTE — Telephone Encounter (Signed)
Copied from [place] [id number]. Topic: Quick Communication - Rx Refill/Question >> Jun 05, 2018  2:08 PM Alanda Slim E wrote: Medication: montelukast (SINGULAIR) 10 MG tablet meloxicam (MOBIC) 15 MG tablet - Pt insurance changed and she is in [place] but her new provider is not seeing any of their new patients until June due to the corona virus and asked that Pts old provider provide Rx until then/ please advise and call Pt   Has the patient contacted their pharmacy? No   Preferred Pharmacy (with phone number or street name): [facility] ([address]), [place] [phone] (Phone) [phone] (Fax)    Agent: Please be advised that RX refills may take up to 3 business days. We ask that you follow-up with your pharmacy.

## 2018-06-05 NOTE — Telephone Encounter (Addendum)
Rx printed. [facility] fax and phone lines are down today. Pt informed Rx is upfront for p/u.

## 2018-07-23 ENCOUNTER — Ambulatory Visit: Payer: BLUE CROSS/BLUE SHIELD

## 2018-07-29 ENCOUNTER — Encounter (HOSPITAL_COMMUNITY): Payer: Self-pay

## 2018-07-29 ENCOUNTER — Ambulatory Visit (HOSPITAL_COMMUNITY)
Admission: EM | Admit: 2018-07-29 | Discharge: 2018-07-29 | Disposition: A | Discharge status: Home / Self Care | Payer: BLUE CROSS/BLUE SHIELD | Attending: Family Medicine | Admitting: Family Medicine

## 2018-07-29 ENCOUNTER — Other Ambulatory Visit: Payer: Self-pay

## 2018-07-29 DIAGNOSIS — L03012 Cellulitis of left finger: Secondary | ICD-10-CM | POA: Diagnosis not present

## 2018-07-29 MED ORDER — MUPIROCIN 2 % EX OINT: 1.0000 "application " | TOPICAL_OINTMENT | Freq: Two times a day (BID) | CUTANEOUS | 0 refills | Status: AC

## 2018-07-29 MED ORDER — CEPHALEXIN 500 MG PO CAPS: 500.0000 mg | ORAL_CAPSULE | Freq: Four times a day (QID) | ORAL | 0 refills | Status: AC

## 2018-07-29 MED ORDER — MUPIROCIN 2 % EX OINT: 1.0000 "application " | TOPICAL_OINTMENT | Freq: Two times a day (BID) | CUTANEOUS | 0 refills | Status: DC

## 2018-07-29 MED ORDER — CEPHALEXIN 500 MG PO CAPS: 500.0000 mg | ORAL_CAPSULE | Freq: Four times a day (QID) | ORAL | 0 refills | Status: DC

## 2018-07-29 NOTE — ED Triage Notes (Signed)
Patient presents to Urgent Care with complaints of swelling on the tip of her third finger on her left hand since 5 days ago. Patient reports yesterday it was really hard and swollen and today it is softer. Pt is concerned she has an infection in it.

## 2018-07-29 NOTE — ED Provider Notes (Signed)
[name]    CSN: [id number] Arrival date & time: 07/29/18  1254     History   Chief Complaint Chief Complaint  Patient presents with  . finger swelling    HPI Sarah Tyler is a 53 y.o. female history of DM type II, asthma, presenting today for evaluation of finger swelling.  Patient states that over the past 5 days she has developed increased redness, swelling and pain to her distal left middle finger.  She states that the swelling has slightly improved, but continues to have pain.  Denies injury.  Denies difficulty moving fingers.  Denies fevers.  She has been doing soaks with Epsom salt.  HPI  Past Medical History:  Diagnosis Date  . Arthritis    shoulders, hands  . Asthma   . Diabetes mellitus without complication ([place])    pre-diabetic diet controlled  . Fibroids   . Irregular bleeding    Vaginal  . Seasonal allergies   . SVD (spontaneous vaginal delivery)    x 2    Patient Active Problem List   Diagnosis Date Noted  . Right cervical radiculopathy 01/13/2018  . Hidradenitis 12/26/2017  . Prediabetes 05/31/2017  . Acute pain of left knee 03/15/2017  . Family history of ovarian cancer 02/27/2016  . Colon cancer screening 02/27/2016  . Encounter for general adult medical examination with abnormal findings 09/06/2015  . Asthma 09/06/2015  . Neck pain, bilateral 03/30/2013  . Paresthesia 03/30/2013  . S/P vaginal hysterectomy 01/05/2013  . Obesity (BMI 30-39.9) 10/14/2012  . Allergic rhinitis 10/14/2012  . Trochanteric bursitis of right hip 10/14/2012  . Tobacco user 11/09/2010    Past Surgical History:  Procedure Laterality Date  . ABDOMINAL HYSTERECTOMY    . TONSILLECTOMY    . TONSILLECTOMY AND ADENOIDECTOMY    . TUBAL LIGATION    . VAGINAL HYSTERECTOMY N/A 01/05/2013   Procedure: TOTAL VAGINAL HYSTERECTOMY ;  Surgeon: Osborne Oman, MD;  Location: [facility];  Service: Gynecology;  Laterality: N/A;    OB History    Gravida  4   Para   2   Term  2   Preterm      AB  2   Living  2     SAB  1   TAB  1   Ectopic      Multiple      Live Births               Home Medications    Prior to Admission medications   Medication Sig Start Date End Date Taking? Authorizing Provider  albuterol (PROVENTIL HFA;VENTOLIN HFA) 108 (90 Base) MCG/ACT inhaler Inhale 2 puffs into the lungs every 4 (four) hours as needed for wheezing. 05/20/17   Lance Sell, NP  aspirin 81 MG tablet Take 81 mg by mouth daily.      [provider]  cephALEXin (KEFLEX) 500 MG capsule Take 1 capsule (500 mg total) by mouth 4 (four) times daily for 7 days. 07/29/18 08/05/18  Keiana Tavella C, PA-Tyler  Cyanocobalamin (VITAMIN B-12 PO) Take by mouth.    [provider]  fish oil-omega-3 fatty acids 1000 MG capsule Take 2 g by mouth daily.     [provider]  gabapentin (NEURONTIN) 100 MG capsule Take 2 capsules (200 mg total) by mouth at bedtime. 01/13/18   Lyndal Pulley, DO  meloxicam (MOBIC) 15 MG tablet Take 1 tablet (15 mg total) by mouth daily. 11/04/17   Shambley,  Delphia Grates, NP  montelukast (SINGULAIR) 10 MG tablet Take 1 tablet (10 mg total) by mouth daily. 06/05/18   Lance Sell, NP  Multiple Vitamin (MULTIVITAMIN) tablet Take 1 tablet by mouth daily.    [provider]  mupirocin cream (BACTROBAN) 2 % Apply 1 application topically 2 (two) times daily. 12/10/17   Loura Halt A, NP  mupirocin ointment (BACTROBAN) 2 % Apply 1 application topically 2 (two) times daily. 07/29/18   Hildred Pharo C, PA-Tyler  naproxen (NAPROSYN) 500 MG tablet TAKE 1 TABLET BY MOUTH TWICE DAILY AS NEEDED 11/05/17   Lance Sell, NP  predniSONE (DELTASONE) 50 MG tablet Take 1 tablet (50 mg total) by mouth daily with breakfast. 01/13/18   Lyndal Pulley, DO  triamcinolone cream (KENALOG) 0.1 % Apply 1 application topically 2 (two) times daily. 04/15/18   Lance Sell, NP    Family History Family History   Problem Relation Age of Onset  . Hypertension Mother   . Ovarian cancer Mother        cervical  . HIV/AIDS Father   . Colon cancer Neg Hx   . Rectal cancer Neg Hx   . Stomach cancer Neg Hx     Social History Social History   Tobacco Use  . Smoking status: Current Every Day Smoker    Packs/day: 1.00    Years: 27.00    Pack years: 27.00    Types: Cigarettes  . Smokeless tobacco: Never Used  Substance Use Topics  . Alcohol use: Yes    Alcohol/week: 4.0 standard drinks    Types: 4 Glasses of wine per week    Comment: once monthly  . Drug use: No     Allergies   Patient has no known allergies.   Review of Systems Review of Systems  Constitutional: Negative for fatigue and fever.  Eyes: Negative for visual disturbance.  Respiratory: Negative for shortness of breath.   Cardiovascular: Negative for chest pain.  Gastrointestinal: Negative for abdominal pain, nausea and vomiting.  Musculoskeletal: Positive for joint swelling. Negative for arthralgias.  Skin: Positive for color change. Negative for rash and wound.  Neurological: Negative for dizziness, weakness, light-headedness and headaches.     Physical Exam Triage Vital Signs ED Triage Vitals  Enc Vitals Group     BP 07/29/18 1310 138/81     Pulse Rate 07/29/18 1310 85     Resp 07/29/18 1310 18     Temp 07/29/18 1310 97.7 F (36.5 Tyler)     Temp Source 07/29/18 1310 Oral     SpO2 07/29/18 1310 100 %     Weight 07/29/18 1308 170 lb (77.1 kg)     Height 07/29/18 1308 5\' 1"  (1.549 m)     Head Circumference --      Peak Flow --      Pain Score 07/29/18 1308 4     Pain Loc --      Pain Edu? --      Excl. in [place]? --    No data found.  Updated Vital Signs BP 138/81 (BP Location: Right Arm)   Pulse 85   Temp 97.7 F (36.5 Tyler) (Oral)   Resp 18   Ht 5\' 1"  (1.549 m)   Wt 170 lb (77.1 kg)   LMP 12/26/2012   SpO2 100%   BMI 32.12 kg/m   Visual Acuity Right Eye Distance:   Left Eye Distance:   Bilateral  Distance:    Right Eye Near:  Left Eye Near:    Bilateral Near:     Physical Exam Vitals signs and nursing note reviewed.  Constitutional:      Appearance: She is well-developed.     Comments: No acute distress  HENT:     Head: Normocephalic and atraumatic.     Nose: Nose normal.  Eyes:     Conjunctiva/sclera: Conjunctivae normal.  Neck:     Musculoskeletal: Neck supple.  Cardiovascular:     Rate and Rhythm: Normal rate.  Pulmonary:     Effort: Pulmonary effort is normal. No respiratory distress.  Abdominal:     General: There is no distension.  Musculoskeletal: Normal range of motion.     Comments: Full active range of motion of left middle finger at DIP and PIP  Skin:    General: Skin is warm and dry.     Findings: Erythema present.     Comments: Distal left third finger with swelling and erythema around nailbed, swelling does not extend to distal pulp, no obvious areas of white discoloration or pus pockets; erythema does not extend beyond DIP  Neurological:     Mental Status: She is alert and oriented to person, place, and time.      UC Treatments / Results  Labs (all labs ordered are listed, but only abnormal results are displayed) Labs Reviewed - No data to display  EKG None  Radiology No results found.  Procedures Procedures (including critical care time)  Medications Ordered in UC Medications - No data to display  Initial Impression / Assessment and Plan / UC Course  I have reviewed the triage vital signs and the nursing notes.  Pertinent labs & imaging results that were available during my care of the patient were reviewed by me and considered in my medical decision making (see chart for details).    Paronychia to left middle finger.  Will treat with Keflex, Bactroban, warm soaks.  No areas of pus to be drained today.  Exam not suggestive of felon.  We will continue to monitor,Discussed strict return precautions. Patient verbalized understanding and  is agreeable with plan.  Final Clinical Impressions(s) / UC Diagnoses   Final diagnoses:  Paronychia of left middle finger     Discharge Instructions     Begin Keflex 4 times daily for the next week. Soak finger in warm water or warm compresses to further allow for healing, dry well and apply Bactroban around the nailbed twice daily If your finger starts to drain, gently massage to express further drainage  Follow-up if redness, swelling, pain spreading to tip of finger or spreading further up arm, developing fevers   ED Prescriptions    Medication Sig Dispense Auth. Provider   cephALEXin (KEFLEX) 500 MG capsule  (Status: Discontinued) Take 1 capsule (500 mg total) by mouth 4 (four) times daily for 7 days. 28 capsule Shriyan Arakawa C, PA-Tyler   mupirocin ointment (BACTROBAN) 2 %  (Status: Discontinued) Apply 1 application topically 2 (two) times daily. 22 g Demetria Lightsey C, PA-Tyler   cephALEXin (KEFLEX) 500 MG capsule Take 1 capsule (500 mg total) by mouth 4 (four) times daily for 7 days. 28 capsule Ludell Zacarias C, PA-Tyler   mupirocin ointment (BACTROBAN) 2 % Apply 1 application topically 2 (two) times daily. 22 g Sheldon Sem, [place] C, PA-Tyler     Controlled Substance Prescriptions  Controlled Substance Registry consulted? Not Applicable   Janith Lima, [place] 07/29/18 1328

## 2018-07-29 NOTE — Discharge Instructions (Signed)
Begin Keflex 4 times daily for the next week. Soak finger in warm water or warm compresses to further allow for healing, dry well and apply Bactroban around the nailbed twice daily If your finger starts to drain, gently massage to express further drainage  Follow-up if redness, swelling, pain spreading to tip of finger or spreading further up arm, developing fevers

## 2018-09-03 ENCOUNTER — Ambulatory Visit: Payer: BLUE CROSS/BLUE SHIELD

## 2019-03-25 ENCOUNTER — Other Ambulatory Visit: Payer: Self-pay

## 2019-03-25 DIAGNOSIS — Z20822 Contact with and (suspected) exposure to covid-19: Secondary | ICD-10-CM

## 2019-03-26 LAB — NOVEL CORONAVIRUS, NAA: SARS-CoV-2, NAA: NOT DETECTED

## 2019-05-16 ENCOUNTER — Ambulatory Visit: Payer: BLUE CROSS/BLUE SHIELD | Attending: Internal Medicine

## 2019-05-16 DIAGNOSIS — Z23 Encounter for immunization: Secondary | ICD-10-CM | POA: Insufficient documentation

## 2019-05-16 NOTE — Progress Notes (Signed)
   Covid-19 Vaccination Clinic  Name:  Sarah Tyler    MRN: [id number] DOB: [birth year]  05/16/2019  [name] was observed post Covid-19 immunization for 15 minutes without incidence. She was provided with Vaccine Information Sheet and instruction to access the V-Safe system.   [name] was instructed to call 911 with any severe reactions post vaccine: Marland Kitchen Difficulty breathing  . Swelling of your face and throat  . A fast heartbeat  . A bad rash all over your body  . Dizziness and weakness    Immunizations Administered    Name Date Dose VIS Date Route   Pfizer COVID-19 Vaccine 05/16/2019  2:20 PM 0.3 mL 02/27/2019 Intramuscular   Manufacturer: [place]   Lot: [id number]   [place]: [id number]

## 2019-06-06 ENCOUNTER — Ambulatory Visit: Payer: BLUE CROSS/BLUE SHIELD | Attending: Internal Medicine

## 2019-06-06 DIAGNOSIS — Z23 Encounter for immunization: Secondary | ICD-10-CM

## 2019-06-06 NOTE — Progress Notes (Signed)
   Covid-19 Vaccination Clinic  Name:  Sarah Tyler    MRN: [id number] DOB: 06-15-1965  06/06/2019  Ms. Wais was observed post Covid-19 immunization for 15 minutes without incident. She was provided with Vaccine Information Sheet and instruction to access the V-Safe system.   Ms. Satchwell was instructed to call 911 with any severe reactions post vaccine: Marland Kitchen Difficulty breathing  . Swelling of face and throat  . A fast heartbeat  . A bad rash all over body  . Dizziness and weakness   Immunizations Administered    Name Date Dose VIS Date Route   Pfizer COVID-19 Vaccine 06/06/2019  8:13 AM 0.3 mL 02/27/2019 Intramuscular   Manufacturer: [place]   Lot: [id number]   [place]: [id number]

## 2020-09-07 ENCOUNTER — Encounter (HOSPITAL_COMMUNITY): Payer: Self-pay

## 2020-09-07 ENCOUNTER — Ambulatory Visit (HOSPITAL_COMMUNITY)
Admission: EM | Admit: 2020-09-07 | Discharge: 2020-09-07 | Disposition: A | Discharge status: Home / Self Care | Payer: BLUE CROSS/BLUE SHIELD

## 2020-09-07 ENCOUNTER — Other Ambulatory Visit: Payer: Self-pay

## 2020-09-07 DIAGNOSIS — G44209 Tension-type headache, unspecified, not intractable: Secondary | ICD-10-CM | POA: Diagnosis not present

## 2020-09-07 MED ORDER — KETOROLAC TROMETHAMINE 30 MG/ML IJ SOLN
30.0000 mg | Freq: Once | INTRAMUSCULAR | Status: AC
  Administered 2020-09-07: 30 mg via INTRAMUSCULAR

## 2020-09-07 MED ORDER — KETOROLAC TROMETHAMINE 30 MG/ML IJ SOLN
INTRAMUSCULAR | Status: AC
  Filled 2020-09-07: qty 1

## 2020-09-07 NOTE — ED Triage Notes (Addendum)
Pt reports migraine for several days, otc meds not helping. Reports somewhat dizzy as well and reports diarrhea.

## 2020-09-07 NOTE — Discharge Instructions (Addendum)
-  Avoid NSAIDs like ibuprofen/alleve for the rest of the day, you can start taking these again tomorrow. You can still take tylenol today. -Head straight to the emergency department if you develop new symptoms like the worst headache of your life, vision changes, vision loss, weakness in your arms or legs, worsening of the dizziness, chest pain.

## 2020-09-07 NOTE — ED Provider Notes (Signed)
[place]    CSN: [id number] Arrival date & time: 09/07/20  1153      History   Chief Complaint Chief Complaint  Patient presents with   Headache   Dizziness   Diarrhea    HPI Sarah Tyler is a 55 y.o. female presenting with few days of migraine. Medical history asthma, diabetes, hidradenitis, tobacco use.  Notes 3 days of throbbing 7/10 pain behind forehead, temporarily relieved by alleve.  Also notes some dizziness and diarrhea.  States she had a few episodes of dizziness 1 day ago when going from sitting to standing, these resolved on their own after about 30 seconds.  Photophobia and nausea without vomiting. Few episodes of watery diarrhea daily. During these episodes denied weakness, chest pain, shortness of breath, syncope.  Denies absolutely any dizziness today.  Denies vision changes, weakness in arms or legs, worst headache of life, thunderclap headache. Denies cough, congestion, fevers/chills.   HPI  Past Medical History:  Diagnosis Date   Arthritis    shoulders, hands   Asthma    Diabetes mellitus without complication ([place])    pre-diabetic diet controlled   Fibroids    Irregular bleeding    Vaginal   Seasonal allergies    SVD (spontaneous vaginal delivery)    x 2    Patient Active Problem List   Diagnosis Date Noted   Right cervical radiculopathy 01/13/2018   Hidradenitis 12/26/2017   Prediabetes 05/31/2017   Acute pain of left knee 03/15/2017   Family history of ovarian cancer 02/27/2016   Colon cancer screening 02/27/2016   Encounter for general adult medical examination with abnormal findings 09/06/2015   Asthma 09/06/2015   Neck pain, bilateral 03/30/2013   Paresthesia 03/30/2013   S/P vaginal hysterectomy 01/05/2013   Obesity (BMI 30-39.9) 10/14/2012   Allergic rhinitis 10/14/2012   Trochanteric bursitis of right hip 10/14/2012   Tobacco user 11/09/2010    Past Surgical History:  Procedure Laterality Date   ABDOMINAL HYSTERECTOMY      TONSILLECTOMY     TONSILLECTOMY AND ADENOIDECTOMY     TUBAL LIGATION     VAGINAL HYSTERECTOMY N/A 01/05/2013   Procedure: TOTAL VAGINAL HYSTERECTOMY ;  Surgeon: Osborne Oman, MD;  Location: [facility];  Service: Gynecology;  Laterality: N/A;    OB History     Gravida  4   Para  2   Term  2   Preterm      AB  2   Living  2      SAB  1   IAB  1   Ectopic      Multiple      Live Births               Home Medications    Prior to Admission medications   Medication Sig Start Date End Date Taking? Authorizing Provider  albuterol (PROVENTIL HFA;VENTOLIN HFA) 108 (90 Base) MCG/ACT inhaler Inhale 2 puffs into the lungs every 4 (four) hours as needed for wheezing. 05/20/17  Yes Lance Sell, NP  aspirin 81 MG tablet Take 81 mg by mouth daily.     Yes [provider]  Cyanocobalamin (VITAMIN B-12 PO) Take by mouth.   Yes [provider]  fish oil-omega-3 fatty acids 1000 MG capsule Take 2 g by mouth daily.    Yes [provider]  Multiple Vitamin (MULTIVITAMIN) tablet Take 1 tablet by mouth daily.   Yes [provider]  VITAMIN D PO Take  by mouth.   Yes [provider]  gabapentin (NEURONTIN) 100 MG capsule Take 2 capsules (200 mg total) by mouth at bedtime. 01/13/18   Lyndal Pulley, DO  meloxicam (MOBIC) 15 MG tablet Take 1 tablet (15 mg total) by mouth daily. 11/04/17   Lance Sell, NP  montelukast (SINGULAIR) 10 MG tablet Take 1 tablet (10 mg total) by mouth daily. 06/05/18   Lance Sell, NP  mupirocin cream (BACTROBAN) 2 % Apply 1 application topically 2 (two) times daily. 12/10/17   Loura Halt A, NP  mupirocin ointment (BACTROBAN) 2 % Apply 1 application topically 2 (two) times daily. 07/29/18   Wieters, Hallie C, PA-C  naproxen (NAPROSYN) 500 MG tablet TAKE 1 TABLET BY MOUTH TWICE DAILY AS NEEDED 11/05/17   Lance Sell, NP  predniSONE (DELTASONE) 50 MG tablet Take 1 tablet (50 mg total)  by mouth daily with breakfast. 01/13/18   Lyndal Pulley, DO  triamcinolone cream (KENALOG) 0.1 % Apply 1 application topically 2 (two) times daily. 04/15/18   Lance Sell, NP    Family History Family History  Problem Relation Age of Onset   Hypertension Mother    Ovarian cancer Mother        cervical   HIV/AIDS Father    Colon cancer Neg Hx    Rectal cancer Neg Hx    Stomach cancer Neg Hx     Social History Social History   Tobacco Use   Smoking status: Every Day    Packs/day: 0.50    Years: 27.00    Pack years: 13.50    Types: Cigarettes   Smokeless tobacco: Never  Vaping Use   Vaping Use: Never used  Substance Use Topics   Alcohol use: Yes    Alcohol/week: 4.0 standard drinks    Types: 4 Glasses of wine per week    Comment: once monthly   Drug use: No     Allergies   Patient has no known allergies.   Review of Systems Review of Systems  Constitutional:  Negative for appetite change, chills and fever.  HENT:  Negative for ear pain, rhinorrhea, sinus pressure, sinus pain and sore throat.   Eyes:  Positive for photophobia. Negative for redness and visual disturbance.  Respiratory:  Negative for cough, chest tightness, shortness of breath and wheezing.   Cardiovascular:  Negative for chest pain and palpitations.  Gastrointestinal:  Positive for diarrhea and nausea. Negative for abdominal pain, constipation and vomiting.  Genitourinary:  Negative for dysuria, frequency and urgency.  Musculoskeletal:  Negative for myalgias.  Neurological:  Positive for headaches. Negative for dizziness and weakness.  Psychiatric/Behavioral:  Negative for confusion.   All other systems reviewed and are negative.   Physical Exam Triage Vital Signs ED Triage Vitals [09/07/20 1247]  Enc Vitals Group     BP      Pulse      Resp      Temp      Temp src      SpO2      Weight      Height      Head Circumference      Peak Flow      Pain Score 7     Pain Loc       Pain Edu?      Excl. in [place]?    No data found.  Updated Vital Signs BP 126/83   Pulse 72   Temp 97.8 F (36.6 C)  Resp 18   LMP 12/26/2012   SpO2 96%   Visual Acuity Right Eye Distance:   Left Eye Distance:   Bilateral Distance:    Right Eye Near:   Left Eye Near:    Bilateral Near:     Physical Exam Vitals reviewed.  Constitutional:      General: She is not in acute distress.    Appearance: Normal appearance. She is not ill-appearing.  HENT:     Head: Normocephalic and atraumatic.     Right Ear: Hearing, tympanic membrane, ear canal and external ear normal. No swelling or tenderness. There is no impacted cerumen. No mastoid tenderness. Tympanic membrane is not perforated, erythematous, retracted or bulging.     Left Ear: Hearing, tympanic membrane, ear canal and external ear normal. No swelling or tenderness. There is no impacted cerumen. No mastoid tenderness. Tympanic membrane is not perforated, erythematous, retracted or bulging.     Nose:     Right Sinus: No maxillary sinus tenderness or frontal sinus tenderness.     Left Sinus: No maxillary sinus tenderness or frontal sinus tenderness.     Mouth/Throat:     Mouth: Mucous membranes are moist.     Pharynx: Uvula midline. No oropharyngeal exudate or posterior oropharyngeal erythema.     Tonsils: No tonsillar exudate.  Eyes:     Extraocular Movements: Extraocular movements intact.     Pupils: Pupils are equal, round, and reactive to light.  Cardiovascular:     Rate and Rhythm: Normal rate and regular rhythm.     Heart sounds: Normal heart sounds.  Pulmonary:     Effort: Pulmonary effort is normal.     Breath sounds: Normal breath sounds and air entry. No wheezing, rhonchi or rales.  Chest:     Chest wall: No tenderness.  Abdominal:     General: Abdomen is flat. Bowel sounds are normal.     Tenderness: There is no abdominal tenderness. There is no guarding or rebound.  Musculoskeletal:     Cervical back: Normal  range of motion and neck supple. No rigidity.  Lymphadenopathy:     Cervical: No cervical adenopathy.  Skin:    Capillary Refill: Capillary refill takes less than 2 seconds.  Neurological:     General: No focal deficit present.     Mental Status: She is alert and oriented to person, place, and time. Mental status is at baseline.     Cranial Nerves: Cranial nerves are intact. No cranial nerve deficit or facial asymmetry.     Sensory: Sensation is intact. No sensory deficit.     Motor: Motor function is intact. No weakness.     Coordination: Coordination is intact. Coordination normal.     Gait: Gait is intact. Gait normal.     Comments: PERRLA, EOMI. CN 2-12 intact. No weakness or numbness in UEs or LEs.  Psychiatric:        Attention and Perception: Attention and perception normal.        Mood and Affect: Mood and affect normal.        Behavior: Behavior normal. Behavior is cooperative.        Thought Content: Thought content normal.        Judgment: Judgment normal.     UC Treatments / Results  Labs (all labs ordered are listed, but only abnormal results are displayed) Labs Reviewed - No data to display  EKG   Radiology No results found.  Procedures Procedures (including critical care time)  Medications Ordered in UC Medications  ketorolac (TORADOL) 30 MG/ML injection 30 mg (has no administration in time range)    Initial Impression / Assessment and Plan / UC Course  I have reviewed the triage vital signs and the nursing notes.  Pertinent labs & imaging results that were available during my care of the patient were reviewed by me and considered in my medical decision making (see chart for details).     This patient is a very pleasant 55 y.o. year old female presenting with tension headache x3 days. Symptoms relieved by alleve, last dose of this was 1 day ago. No red flag symptoms, benign neuro exam. Toradol administered today, patient does not have kidney disease and  has not taken NSAID today. Red flag symptoms and ED return precautions discussed. Patient verbalizes understanding and agreement.    Final Clinical Impressions(s) / UC Diagnoses   Final diagnoses:  Acute non intractable tension-type headache     Discharge Instructions      -Avoid NSAIDs like ibuprofen/alleve for the rest of the day, you can start taking these again tomorrow. You can still take tylenol today. -Head straight to the emergency department if you develop new symptoms like the worst headache of your life, vision changes, vision loss, weakness in your arms or legs, worsening of the dizziness, chest pain.     ED Prescriptions   None    PDMP not reviewed this encounter.   Hazel Sams, PA-C 09/07/20 1319
